# Patient Record
Sex: Female | Born: 1970 | Race: Black or African American | Hispanic: No | Marital: Single | Smoking: Current every day smoker
Health system: Southern US, Community
[De-identification: ages and names within clinical notes are randomized; demographics above are authoritative.]

## PROBLEM LIST (undated history)

## (undated) DIAGNOSIS — R011 Cardiac murmur, unspecified: Secondary | ICD-10-CM

## (undated) DIAGNOSIS — N83209 Unspecified ovarian cyst, unspecified side: Secondary | ICD-10-CM

## (undated) DIAGNOSIS — M797 Fibromyalgia: Secondary | ICD-10-CM

## (undated) DIAGNOSIS — N393 Stress incontinence (female) (male): Secondary | ICD-10-CM

## (undated) DIAGNOSIS — I2699 Other pulmonary embolism without acute cor pulmonale: Secondary | ICD-10-CM

## (undated) DIAGNOSIS — L309 Dermatitis, unspecified: Secondary | ICD-10-CM

## (undated) DIAGNOSIS — F419 Anxiety disorder, unspecified: Secondary | ICD-10-CM

## (undated) HISTORY — DX: Cardiac murmur, unspecified: R01.1

## (undated) HISTORY — DX: Fibromyalgia: M79.7

## (undated) HISTORY — DX: Unspecified ovarian cyst, unspecified side: N83.209

## (undated) HISTORY — DX: Dermatitis, unspecified: L30.9

## (undated) HISTORY — DX: Stress incontinence (female) (male): N39.3

## (undated) HISTORY — DX: Anxiety disorder, unspecified: F41.9

---

## 1999-02-08 ENCOUNTER — Inpatient Hospital Stay (HOSPITAL_COMMUNITY): Admission: AD | Admit: 1999-02-08 | Discharge: 1999-02-08 | Payer: Self-pay | Admitting: Obstetrics and Gynecology

## 1999-07-12 ENCOUNTER — Encounter: Payer: Self-pay | Admitting: Emergency Medicine

## 1999-07-12 ENCOUNTER — Emergency Department (HOSPITAL_COMMUNITY): Admission: EM | Admit: 1999-07-12 | Discharge: 1999-07-12 | Payer: Self-pay | Admitting: Emergency Medicine

## 2000-02-12 ENCOUNTER — Other Ambulatory Visit: Admission: RE | Admit: 2000-02-12 | Discharge: 2000-02-12 | Payer: Self-pay | Admitting: Obstetrics and Gynecology

## 2001-03-17 ENCOUNTER — Other Ambulatory Visit: Admission: RE | Admit: 2001-03-17 | Discharge: 2001-03-17 | Payer: Self-pay | Admitting: Obstetrics and Gynecology

## 2002-04-20 ENCOUNTER — Other Ambulatory Visit: Admission: RE | Admit: 2002-04-20 | Discharge: 2002-04-20 | Payer: Self-pay | Admitting: Obstetrics and Gynecology

## 2004-06-27 ENCOUNTER — Encounter: Admission: RE | Admit: 2004-06-27 | Discharge: 2004-06-27 | Payer: Self-pay | Admitting: Internal Medicine

## 2005-02-07 ENCOUNTER — Other Ambulatory Visit: Admission: RE | Admit: 2005-02-07 | Discharge: 2005-02-07 | Payer: Self-pay | Admitting: Obstetrics and Gynecology

## 2006-05-12 ENCOUNTER — Emergency Department (HOSPITAL_COMMUNITY): Admission: EM | Admit: 2006-05-12 | Discharge: 2006-05-12 | Payer: Self-pay | Admitting: Family Medicine

## 2007-02-27 ENCOUNTER — Inpatient Hospital Stay (HOSPITAL_COMMUNITY): Admission: AD | Admit: 2007-02-27 | Discharge: 2007-02-27 | Payer: Self-pay | Admitting: Obstetrics and Gynecology

## 2007-03-07 ENCOUNTER — Inpatient Hospital Stay (HOSPITAL_COMMUNITY): Admission: AD | Admit: 2007-03-07 | Discharge: 2007-03-10 | Payer: Self-pay | Admitting: Obstetrics and Gynecology

## 2007-03-08 ENCOUNTER — Encounter (INDEPENDENT_AMBULATORY_CARE_PROVIDER_SITE_OTHER): Payer: Self-pay | Admitting: Obstetrics and Gynecology

## 2008-05-17 ENCOUNTER — Encounter: Admission: RE | Admit: 2008-05-17 | Discharge: 2008-05-17 | Payer: Self-pay | Admitting: Internal Medicine

## 2010-02-04 ENCOUNTER — Encounter: Payer: Self-pay | Admitting: Obstetrics and Gynecology

## 2010-05-29 NOTE — Discharge Summary (Signed)
NAMESHYLIN, KEIZER               ACCOUNT NO.:  [id number]   MEDICAL RECORD NO.:  [id number]          PATIENT TYPE:  INP   LOCATION:  9131                          FACILITY:  [facility]   PHYSICIAN:  Zenaida Niece, M.D.DATE OF BIRTH:  12-07-1970   DATE OF ADMISSION:  03/07/2007  DATE OF DISCHARGE:  03/10/2007                               DISCHARGE SUMMARY   ADMISSION DIAGNOSIS:  Intrauterine pregnancy at 39 weeks.  Previous  cesarean section.  In labor, variable decelerations, and small for  gestational age.   DISCHARGE DIAGNOSIS:  Intrauterine pregnancy at 39 weeks.  Previous  cesarean section.  In labor, variable decelerations, and small for  gestational age.   PROCEDURE:  On February 21, she had a repeat low transverse cesarean  section.   HISTORY AND PHYSICAL:  Please see chart for full dictated history and  physical.  Briefly, this is a 40 year old black female, gravida 2, para  1-0-0-1 with an EGA of [redacted] weeks who was scheduled for a repeat cesarean  section on February 23; however, she presented on February 21 with a  complaint of  possibly leaking fluid and contractions.  Speculum exam  did not confirm rupture of membranes and cervix was unchanged from the  exam in the office; however, fetal heart tracing had variable decels.  Biophysical profile was 8/8 with low normal amniotic fluid volume.  However, due to the variable decels and contractions, we elected to go  ahead and admit her and proceed with cesarean section.  Again, please  see full history and physical for prenatal labs and the remainder of her  history.  Exam significant for a gravid abdomen with a well-healed  vertical scar and cervix was 1, 80 and -2.   HOSPITAL COURSE:  The patient was admitted on February 21 and underwent  repeat low transverse cesarean section under spinal anesthesia.  She had  normal gravid anatomy and delivered a viable female infant with Apgars  of 8 and 9 who weighed 4 pounds, 14  ounces.  Estimated blood loss was  800 mL.  Postpartum she had no significant complications.  Predelivery  hemoglobin 10.7, postdelivery 8.4.  On postoperative #3, she was felt to  be stable enough for discharge home.  At that time, her incision was  healing well and her staples were removed and Steri-Strips applied.   DISCHARGE INSTRUCTIONS:  Regular diet, pelvic rest, no strenuous  activity.  Followup is in 2 weeks for an incision check.  Medications  are Percocet #40, 1 to 2 p.o. q.4-6h. p.r.n. pain and over-the-counter  ibuprofen as needed.  She was  given our discharge pamphlet.      Zenaida Niece, M.D.  Electronically Signed     TDM/MEDQ  D:  03/10/2007  T:  03/10/2007  Job:  [id number]

## 2010-05-29 NOTE — Op Note (Signed)
[name], [name]               ACCOUNT NO.:  [id number]   MEDICAL RECORD NO.:  [id number]          PATIENT TYPE:  INP   LOCATION:  9131                          FACILITY:  [facility]   PHYSICIAN:  Zenaida Niece, M.D.DATE OF BIRTH:  December 24, 1970   DATE OF PROCEDURE:  03/07/2007  DATE OF DISCHARGE:                               OPERATIVE REPORT   DATE OF PROCEDURE:  03/07/2007   PREOPERATIVE DIAGNOSIS:  Intrauterine pregnancy at 39 weeks, previous  cesarean section, labor. Fetal heart rate variable decelerations and  small for gestational age.   POSTOPERATIVE DIAGNOSIS:  Intrauterine pregnancy at 39 weeks, previous  cesarean section, labor. Fetal heart rate variable decelerations and  small for gestational age.   PROCEDURE:  Repeat low transverse cesarean section without extensions.   SURGEON:  Zenaida Niece, M.D.   ANESTHESIA:  Spinal.   FINDINGS:  The patient had normal gravid anatomy and delivered a viable  female infant of with Apgars of 8 and 9 and weighed 4 pounds, 14 ounces.   SPECIMEN:  Placenta sent to Pathology.   ESTIMATED BLOOD LOSS:  800 cc.   COMPLICATIONS:  None.   PROCEDURE IN DETAIL:  The patient was taken to the operating room and  placed in the sitting position.  Dr. Pamalee Leyden installed spinal anesthesia.  She was then placed in the dorsal supine position with a left lateral  tilt.  Her abdomen was then prepped and draped in the usual sterile  fashion and a Foley catheter was inserted.  The level of her anesthesia  was found to be adequate, and the abdomen was entered via a standard  Pfannenstiel incision.  Once the peritoneal cavity was entered, an  Youth worker was placed and exposed the  lower uterine segment.  There was a small amount of scarring, and a  bladder flap was created sharply.  A 4 cm transverse incision was then  made in the lower uterine segment.  Once the uterine cavity was entered,  the incision was  extended bilaterally digitally.  Membranes were  ruptured and revealed clear fluid.  Fetal vertex was grasped and  delivered through the incision atraumatically.  Mouth and nares were  suctioned.  A loose nuchal cord x1 was reduced.  The remainder of the  infant was then delivered atraumatically.  Cord was doubly clamped and  cut and the infant handed to the awaiting pediatric team.  The placenta  was delivered spontaneously and after cord blood was collected.  The  uterus was wiped dry with a clean lap pad, and all clots and debris were  removed.  The uterine incision was inspected and found to be free of  extensions.  Uterine incision was closed in one layer being a running  locking layer with #1 chromic with adequate hemostasis.  The tubes and  ovaries were inspected and found to be normal.  The uterine incision  once again was inspected.  Bleeding from serosal edges was controlled  with electrocautery.  Subfascial space was then irrigated and made  hemostatic with electrocautery.  The fascia was closed in a  running  fashion starting at both ends and meeting in the middle with 0 Vicryl.  Subcutaneous tissue was then irrigated and made hemostatic with  electrocautery, and the skin was closed with staples followed by a  sterile dressing.  The patient tolerated the procedure well and was  taken to the recovery room in stable condition.  The urine was slightly  blood-tinged at the end of the case.  Counts were correct x2.  She  received Ancef 1 gm IV prior to the procedure.      Zenaida Niece, M.D.  Electronically Signed     TDM/MEDQ  D:  03/07/2007  T:  03/08/2007  Job:  [id number]

## 2010-05-29 NOTE — H&P (Signed)
[name], [name]               ACCOUNT NO.:  [id number]   MEDICAL RECORD NO.:  [id number]          PATIENT TYPE:  INP   LOCATION:  NA                            [facility]  [facility]   PHYSICIAN:  Zenaida Niece, M.D.DATE OF BIRTH:  [birth year]   DATE OF ADMISSION:  03/09/2007  DATE OF DISCHARGE:                              HISTORY & PHYSICAL   CHIEF COMPLAINT:  Repeat cesarean section.   HISTORY OF PRESENT ILLNESS:  This is a 40 year old black female, gravida  2, para 1-0-0-1, with an EGA of 39+ weeks by an LMP consistent with a 9-  week ultrasound with a due date of February 28 who presents for repeat  cesarean section.  First pregnancy she had a primary low transverse  cesarean section for elevated blood pressure and decreased heart rate.  She is cleared for a trial labor but declines this and wishes to undergo  repeat cesarean section.  Prenatal care has been complicated by first  trimester UTI with E. coli, treated with Macrobid.  She has also  measured size less than dates with reactive nonstress tests.  She has  had borderline decreased amniotic fluid.  Most recent estimated fetal  weight was on February 10, which was 2324 grams, which was less than the  10th percentile, and deepest amniotic fluid pocket was 3.8 cm.  Repeat  amniotic fluid on February 17 had a deepest pocket of 2.9 cm.  Again  NSTs have been reactive.  She has been using Zantac and antacids for  reflux.  No other complications.   PRENATAL LABORATORIES:  Blood type is O+ with a negative antibody  screen.  RPR nonreactive, rubella immune, hepatitis B surface antigen  negative, HIV negative, gonorrhea and chlamydia negative.  Cystic  fibrosis is negative.  MSAFP is normal.  One-hour Glucola is 87 and  group B strep is positive.   PAST OBSTETRIC HISTORY:  In 1993 she had a low transverse cesarean  section for failed induction and failure to progress.  The baby weighed  7 pounds, 8 ounces.   PAST MEDICAL  HISTORY:  History of anxiety and depression as well a  history of migraine headaches.   PAST SURGICAL HISTORY:  Only significant for the cesarean section.   ALLERGIES:  POSSIBLY TO LATEX.   MEDICATIONS:  None.   SOCIAL HISTORY:  She is single and smoked minimally prior to pregnancy.   REVIEW OF SYSTEMS:  Normal complaints of pregnancy.   FAMILY HISTORY:  Noncontributory.   PHYSICAL EXAMINATION:  GENERAL:  This is a well-developed gravid female  in no acute distress.  VITAL SIGNS:  Weight is 161 pounds, blood pressure is 120/70.  NECK:  Supple without lymphadenopathy or thyromegaly.  LUNGS:  Clear to auscultation.  HEART:  Regular rate and rhythm without murmur.  ABDOMEN:  Gravid, soft, nontender with a fundal height of 34 cm.  She  does have a well-healed vertical scar.  EXTREMITIES:  Have trace edema and are nontender.  GENITOURINARY:  Cervix is 1, 80, -2 vertex presentation.   ASSESSMENT:  1. Intrauterine pregnancy at 39+ weeks.  2. Previous cesarean section.  The patient is cleared for a trial of      labor but declines this and wants repeat cesarean section.  All      risks of surgery have been discussed, and she understands.  She has      a previous vertical scar on her skin but would like a transverse      scar with this C-section.  3. Size less than dates with reassuring fetal testing.   The plan is to admit the patient on February 23rd for repeat cesarean  section.      Zenaida Niece, M.D.  Electronically Signed     TDM/MEDQ  D:  03/06/2007  T:  03/06/2007  Job:  [id number]

## 2010-10-05 LAB — CBC
HCT: 24.8 — ABNORMAL LOW
Hemoglobin: 10.7 — ABNORMAL LOW
Hemoglobin: 8.4 — ABNORMAL LOW
MCHC: 33.2
MCV: 83.2
MCV: 84.3
Platelets: 237
RBC: 3.82 — ABNORMAL LOW
WBC: 13.1 — ABNORMAL HIGH
WBC: 13.3 — ABNORMAL HIGH

## 2010-10-05 LAB — RPR: RPR Ser Ql: NONREACTIVE

## 2012-02-03 ENCOUNTER — Other Ambulatory Visit: Payer: Self-pay | Admitting: Obstetrics and Gynecology

## 2012-02-03 ENCOUNTER — Other Ambulatory Visit (HOSPITAL_COMMUNITY)
Admission: RE | Admit: 2012-02-03 | Discharge: 2012-02-03 | Disposition: A | Discharge status: Home / Self Care | Payer: 59 | Source: Ambulatory Visit | Attending: Obstetrics and Gynecology | Admitting: Obstetrics and Gynecology

## 2012-02-03 DIAGNOSIS — Z01419 Encounter for gynecological examination (general) (routine) without abnormal findings: Secondary | ICD-10-CM | POA: Insufficient documentation

## 2012-02-03 DIAGNOSIS — Z1151 Encounter for screening for human papillomavirus (HPV): Secondary | ICD-10-CM | POA: Insufficient documentation

## 2012-02-03 DIAGNOSIS — N760 Acute vaginitis: Secondary | ICD-10-CM | POA: Insufficient documentation

## 2012-03-05 ENCOUNTER — Other Ambulatory Visit: Payer: Self-pay | Admitting: Obstetrics and Gynecology

## 2014-02-03 ENCOUNTER — Emergency Department (HOSPITAL_COMMUNITY): Payer: Medicaid Other

## 2014-02-03 ENCOUNTER — Inpatient Hospital Stay (HOSPITAL_COMMUNITY): Payer: Medicaid Other

## 2014-02-03 ENCOUNTER — Encounter (HOSPITAL_COMMUNITY): Payer: Self-pay | Admitting: *Deleted

## 2014-02-03 ENCOUNTER — Inpatient Hospital Stay (HOSPITAL_COMMUNITY)
Admission: EM | Admit: 2014-02-03 | Discharge: 2014-02-07 | DRG: 759 | Disposition: A | Discharge status: Home / Self Care | Payer: Medicaid Other | Attending: Obstetrics & Gynecology | Admitting: Obstetrics & Gynecology

## 2014-02-03 DIAGNOSIS — N7093 Salpingitis and oophoritis, unspecified: Secondary | ICD-10-CM | POA: Diagnosis not present

## 2014-02-03 DIAGNOSIS — Z91040 Latex allergy status: Secondary | ICD-10-CM

## 2014-02-03 DIAGNOSIS — F17210 Nicotine dependence, cigarettes, uncomplicated: Secondary | ICD-10-CM | POA: Diagnosis present

## 2014-02-03 DIAGNOSIS — K651 Peritoneal abscess: Secondary | ICD-10-CM

## 2014-02-03 DIAGNOSIS — N739 Female pelvic inflammatory disease, unspecified: Secondary | ICD-10-CM | POA: Diagnosis present

## 2014-02-03 DIAGNOSIS — R52 Pain, unspecified: Secondary | ICD-10-CM

## 2014-02-03 DIAGNOSIS — R1030 Lower abdominal pain, unspecified: Secondary | ICD-10-CM

## 2014-02-03 LAB — URINALYSIS, ROUTINE W REFLEX MICROSCOPIC
Bilirubin Urine: NEGATIVE
GLUCOSE, UA: NEGATIVE mg/dL
KETONES UR: 15 mg/dL — AB
Leukocytes, UA: NEGATIVE
Nitrite: NEGATIVE
PH: 6.5 (ref 5.0–8.0)
Protein, ur: NEGATIVE mg/dL
SPECIFIC GRAVITY, URINE: 1.01 (ref 1.005–1.030)
Urobilinogen, UA: 0.2 mg/dL (ref 0.0–1.0)

## 2014-02-03 LAB — COMPREHENSIVE METABOLIC PANEL
ALT: 20 U/L (ref 0–35)
ANION GAP: 9 (ref 5–15)
AST: 27 U/L (ref 0–37)
Albumin: 3.4 g/dL — ABNORMAL LOW (ref 3.5–5.2)
Alkaline Phosphatase: 108 U/L (ref 39–117)
BUN: 7 mg/dL (ref 6–23)
CALCIUM: 8.8 mg/dL (ref 8.4–10.5)
CHLORIDE: 103 meq/L (ref 96–112)
CO2: 23 mmol/L (ref 19–32)
CREATININE: 0.84 mg/dL (ref 0.50–1.10)
GFR, EST NON AFRICAN AMERICAN: 85 mL/min — AB (ref >90)
GLUCOSE: 98 mg/dL (ref 70–99)
POTASSIUM: 3.5 mmol/L (ref 3.5–5.1)
SODIUM: 135 mmol/L (ref 135–145)
Total Bilirubin: 1 mg/dL (ref 0.3–1.2)
Total Protein: 7.3 g/dL (ref 6.0–8.3)

## 2014-02-03 LAB — CBC WITH DIFFERENTIAL/PLATELET
BASOS ABS: 0 10*3/uL (ref 0.0–0.1)
Basophils Relative: 0 % (ref 0–1)
Eosinophils Absolute: 0.1 10*3/uL (ref 0.0–0.7)
Eosinophils Relative: 0 % (ref 0–5)
HCT: 35.1 % — ABNORMAL LOW (ref 36.0–46.0)
HEMOGLOBIN: 11.5 g/dL — AB (ref 12.0–15.0)
LYMPHS PCT: 15 % (ref 12–46)
Lymphs Abs: 3.1 10*3/uL (ref 0.7–4.0)
MCH: 26 pg (ref 26.0–34.0)
MCHC: 32.8 g/dL (ref 30.0–36.0)
MCV: 79.2 fL (ref 78.0–100.0)
MONO ABS: 1.4 10*3/uL — AB (ref 0.1–1.0)
MONOS PCT: 6 % (ref 3–12)
NEUTROS ABS: 16.8 10*3/uL — AB (ref 1.7–7.7)
Neutrophils Relative %: 79 % — ABNORMAL HIGH (ref 43–77)
Platelets: 324 10*3/uL (ref 150–400)
RBC: 4.43 MIL/uL (ref 3.87–5.11)
RDW: 14.3 % (ref 11.5–15.5)
WBC: 21.4 10*3/uL — AB (ref 4.0–10.5)

## 2014-02-03 LAB — URINE MICROSCOPIC-ADD ON

## 2014-02-03 LAB — WET PREP, GENITAL
Clue Cells Wet Prep HPF POC: NONE SEEN
TRICH WET PREP: NONE SEEN
WBC, Wet Prep HPF POC: NONE SEEN
Yeast Wet Prep HPF POC: NONE SEEN

## 2014-02-03 LAB — LIPASE, BLOOD: LIPASE: 23 U/L (ref 11–59)

## 2014-02-03 LAB — PREGNANCY, URINE: Preg Test, Ur: NEGATIVE

## 2014-02-03 MED ORDER — HYDROMORPHONE HCL 2 MG/ML IJ SOLN
2.0000 mg | INTRAMUSCULAR | Status: DC | PRN
  Administered 2014-02-03: 2 mg via INTRAVENOUS
  Filled 2014-02-03: qty 1

## 2014-02-03 MED ORDER — SODIUM CHLORIDE 0.9 % IV BOLUS (SEPSIS)
1000.0000 mL | Freq: Once | INTRAVENOUS | Status: AC
  Administered 2014-02-03: 1000 mL via INTRAVENOUS

## 2014-02-03 MED ORDER — IOHEXOL 300 MG/ML  SOLN
25.0000 mL | Freq: Once | INTRAMUSCULAR | Status: AC | PRN
  Administered 2014-02-03: 25 mL via ORAL

## 2014-02-03 MED ORDER — HYDROMORPHONE HCL 1 MG/ML IJ SOLN
1.0000 mg | Freq: Once | INTRAMUSCULAR | Status: AC
  Administered 2014-02-03: 1 mg via INTRAVENOUS
  Filled 2014-02-03: qty 1

## 2014-02-03 MED ORDER — ONDANSETRON HCL 4 MG/2ML IJ SOLN
4.0000 mg | Freq: Once | INTRAMUSCULAR | Status: AC
Start: 2014-02-03 — End: 2014-02-03
  Administered 2014-02-03: 4 mg via INTRAVENOUS
  Filled 2014-02-03: qty 2

## 2014-02-03 MED ORDER — IOHEXOL 300 MG/ML  SOLN: 100.0000 mL | Freq: Once | INTRAMUSCULAR | Status: DC | PRN

## 2014-02-03 MED ORDER — PIPERACILLIN-TAZOBACTAM 3.375 G IVPB
3.3750 g | Freq: Three times a day (TID) | INTRAVENOUS | Status: DC
  Administered 2014-02-03 – 2014-02-07 (×11): 3.375 g via INTRAVENOUS
  Filled 2014-02-03 (×12): qty 50

## 2014-02-03 MED ORDER — ONDANSETRON HCL 4 MG/2ML IJ SOLN
4.0000 mg | Freq: Four times a day (QID) | INTRAMUSCULAR | Status: DC | PRN
  Administered 2014-02-05 – 2014-02-06 (×2): 4 mg via INTRAVENOUS
  Filled 2014-02-03 (×2): qty 2

## 2014-02-03 MED ORDER — MORPHINE SULFATE 4 MG/ML IJ SOLN
4.0000 mg | Freq: Once | INTRAMUSCULAR | Status: AC
  Administered 2014-02-03: 4 mg via INTRAVENOUS
  Filled 2014-02-03: qty 1

## 2014-02-03 MED ORDER — ZOLPIDEM TARTRATE 5 MG PO TABS: 5.0000 mg | ORAL_TABLET | Freq: Every evening | ORAL | Status: DC | PRN

## 2014-02-03 MED ORDER — IOHEXOL 300 MG/ML  SOLN
80.0000 mL | Freq: Once | INTRAMUSCULAR | Status: AC | PRN
Start: 2014-02-03 — End: 2014-02-03
  Administered 2014-02-03: 1 mL via INTRAVENOUS

## 2014-02-03 MED ORDER — VANCOMYCIN HCL IN DEXTROSE 1-5 GM/200ML-% IV SOLN
1000.0000 mg | Freq: Once | INTRAVENOUS | Status: AC
  Administered 2014-02-03: 1000 mg via INTRAVENOUS
  Filled 2014-02-03: qty 200

## 2014-02-03 MED ORDER — PIPERACILLIN-TAZOBACTAM 3.375 G IVPB 30 MIN
3.3750 g | Freq: Once | INTRAVENOUS | Status: AC
  Administered 2014-02-03: 3.375 g via INTRAVENOUS
  Filled 2014-02-03: qty 50

## 2014-02-03 MED ORDER — PIPERACILLIN-TAZOBACTAM 3.375 G IVPB 30 MIN: 3.3750 g | Freq: Four times a day (QID) | INTRAVENOUS | Status: DC

## 2014-02-03 MED ORDER — DIPHENHYDRAMINE HCL 25 MG PO CAPS
50.0000 mg | ORAL_CAPSULE | ORAL | Status: DC | PRN
  Administered 2014-02-03 – 2014-02-04 (×2): 50 mg via ORAL
  Filled 2014-02-03 (×3): qty 2

## 2014-02-03 MED ORDER — LACTATED RINGERS IV SOLN
INTRAVENOUS | Status: DC
  Administered 2014-02-03 – 2014-02-06 (×8): via INTRAVENOUS

## 2014-02-03 MED ORDER — PRENATAL MULTIVITAMIN CH
1.0000 | ORAL_TABLET | Freq: Every day | ORAL | Status: DC
  Administered 2014-02-04 – 2014-02-06 (×3): 1 via ORAL
  Filled 2014-02-03 (×4): qty 1

## 2014-02-03 MED ORDER — OXYCODONE-ACETAMINOPHEN 5-325 MG PO TABS
1.0000 | ORAL_TABLET | ORAL | Status: DC | PRN
  Administered 2014-02-04 – 2014-02-06 (×5): 2 via ORAL
  Filled 2014-02-03 (×2): qty 2
  Filled 2014-02-03: qty 1
  Filled 2014-02-03 (×3): qty 2

## 2014-02-03 NOTE — ED Notes (Signed)
Pt in c/o suprapubic abd pain since Tuesday, denies n/v, pain worse in certain positions, chills but unsure of fever, no distress noted

## 2014-02-03 NOTE — MAU Note (Signed)
[facility] with abd [facility].  Dx. With tubo-ovarian abscess with localized inflammatory changes and a pre-rectal abscess possible.  Denies vaginal bleeding.

## 2014-02-03 NOTE — ED Notes (Signed)
[facility]ed [facility] for [facility] to [facility]

## 2014-02-03 NOTE — MAU Provider Note (Signed)
See H& P.

## 2014-02-03 NOTE — ED Notes (Signed)
Could not get signature pad to accept signature.  Provided paper copy, signed by pt and RN.  Provided original to secretary for processing, will provide copy in transfer packet.

## 2014-02-03 NOTE — ED Notes (Signed)
Patient transported to CT 

## 2014-02-03 NOTE — ED Provider Notes (Signed)
CSN: [id number]     Arrival date & time 02/03/14  0825 History   First MD Initiated Contact with Patient 02/03/14 [phone]     Chief Complaint  Patient presents with  . Abdominal Pain     (Consider location/radiation/quality/duration/timing/severity/associated sxs/prior Treatment) HPI   Pt presents with diffuse abdominal pain and bloating that began two days ago.  Pain is constant, started as cramping and is now sharp.  Assocaited anorexia, chills.  Exacerbated with standing up straight and palpation.  Has tried ibuprofen and muscle relaxer without imrpovement.    Last BM was two days ago and was normal.  Is able to pass flatus.  Hx 2 c-sections no other abdominal surgeries. Denies fevers, N/V, urinary or vaginal symptoms, change in bowel habits. Denies CP, SOB.  Denies risk of STDs.  History reviewed. No pertinent past medical history. History reviewed. No pertinent past surgical history. History reviewed. No pertinent family history. History  Substance Use Topics  . Smoking status: Current Every Day Smoker  . Smokeless tobacco: Not on file  . Alcohol Use: Not on file   OB History    No data available     Review of Systems  All other systems reviewed and are negative.     Allergies  Review of patient's allergies indicates no known allergies.  Home Medications   Prior to Admission medications   Not on File   BP 125/48 mmHg  Pulse 115  Temp(Src) 98.2 F (36.8 C) (Oral)  Resp 20  Ht  (1.575 m)  Wt 144 lb (65.318 kg)  BMI 26.33 kg/m2  SpO2 10%  LMP 01/20/2014 Physical Exam  Constitutional: She appears well-developed and well-nourished. No distress.  HENT:  Head: Normocephalic and atraumatic.  Neck: Neck supple.  Cardiovascular: Normal rate and regular rhythm.   Pulmonary/Chest: Effort normal and breath sounds normal. No respiratory distress. She has no wheezes. She has no rales.  Abdominal: Soft. She exhibits distension. There is tenderness.  Pt tender  throughout abdomen, reaches for my hand, unable to tolerate deep palpation.  +voluntary guarding.   Neurological: She is alert.  Skin: She is not diaphoretic.  Nursing note and vitals reviewed.   ED Course  Procedures (including critical care time) Labs Review Labs Reviewed  CBC WITH DIFFERENTIAL - Abnormal; Notable for the following:    WBC 21.4 (*)    Hemoglobin 11.5 (*)    HCT 35.1 (*)    Neutrophils Relative % 79 (*)    Neutro Abs 16.8 (*)    Monocytes Absolute 1.4 (*)    All other components within normal limits  COMPREHENSIVE METABOLIC PANEL - Abnormal; Notable for the following:    Albumin 3.4 (*)    GFR calc non Af Amer 85 (*)    All other components within normal limits  URINALYSIS, ROUTINE W REFLEX MICROSCOPIC - Abnormal; Notable for the following:    Hgb urine dipstick LARGE (*)    Ketones, ur 15 (*)    All other components within normal limits  URINE MICROSCOPIC-ADD ON - Abnormal; Notable for the following:    Squamous Epithelial / LPF FEW (*)    Bacteria, UA FEW (*)    All other components within normal limits  LIPASE, BLOOD  PREGNANCY, URINE    Imaging Review Ct Abdomen Pelvis W Contrast  02/03/2014   CLINICAL DATA:  Suprapubic pain for 2 days  EXAM: CT ABDOMEN AND PELVIS WITH CONTRAST  TECHNIQUE: Multidetector CT imaging of the abdomen and pelvis was  performed using the standard protocol following bolus administration of intravenous contrast.  CONTRAST:  75 mL OMNIPAQUE IOHEXOL 300 MG/ML  SOLN  COMPARISON:  02/03/2014  FINDINGS: Lung bases demonstrate some mild dependent atelectatic changes. No focal infiltrate is seen.  Liver demonstrates a hypodensity near the dome posteriorly best seen on image number 12 of series 2. This may represent a simple cyst or hemangioma although evaluation is limited. No other hepatic abnormality is seen. The spleen, gallbladder, adrenal glands and pancreas appear within normal limits. The kidneys are well visualized bilaterally. Small  cyst is noted in the upper pole of the right kidney.  The bladder is well distended. The uterus is well visualized and demonstrates fluid within likely related to the patient's current menstrual status.  Posterior and superior to the uterus, there is significant bowel wall thickening in this rectosigmoid as well as a peripherally enhancing fluid collection which measures approximately 4 x 2.5 cm in greatest dimension. This lies just anterior to the rectum. A second fluid attenuation area is noted which measures 2.3 cm in greatest dimension. This is best seen on image number 67 of series 2 and lies in the expected region of right ovary. Is difficult to assess the etiology of these abnormalities although a tubo-ovarian abscess with localized extension into the pre rectal space is favored. The lack of significant diverticular disease makes diverticulitis a secondary consideration.  IMPRESSION: Significant inflammatory changes in the pelvis with extension into the lower abdomen as described. A tubo-ovarian abscess with localized inflammatory change and a pre-rectal abscess is favored. Surgical consultation is recommended.  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  These results were called by telephone at the time of interpretation on 02/03/2014 at 1:44 pm to Margarita Grizzle, who verbally acknowledged these results.   Electronically Signed   By: Alcide Clever M.D.   On: 02/03/2014 13:49   Dg Abd Acute W/chest  02/03/2014   CLINICAL DATA:  One day history of abdominal pain  EXAM: ACUTE ABDOMEN SERIES (ABDOMEN 2 VIEW & CHEST 1 VIEW)  COMPARISON:  None.  FINDINGS: PA chest: There is minimal right base atelectasis. No edema or consolidation. Heart size and pulmonary vascularity are normal. No adenopathy.  Supine and upright abdomen: There is diffuse stool throughout colon. There is several loops of borderline dilated proximal small bowel. No air-fluid levels. No free air. No abnormal calcifications.  IMPRESSION: Several loops of  borderline dilated proximal small bowel without air-fluid levels. Question early ileus or enteritis. Obstruction not felt to be likely. Diffuse stool throughout colon. No lung edema or consolidation.   Electronically Signed   By: Bretta Bang M.D.   On: 02/03/2014 09:52     EKG Interpretation None       2:47 PM General surgery to consult.   3:20 PM General surgery has reviewed CT scan and advised this is likely primarily a GYN issue and should be seen by GYN.  I have spoken with Dr Debroah Loop who accepts patient in transfer to [facility].  Pt's gynecologist was Dr Jackelyn Knife but she has not seen him since 2009 when her last child was born.    MDM   Final diagnoses:  Pain  Tubo-ovarian abscess  Intra-abdominal abscess    Afebrile nontoxic patient with significant abdominal discomfort, bloating, anorexia, chills.  Found to have leukocytosis of 21 and CT showing TOA and prerectal abscess with inflammatory bowel changes.  Labs otherwise unremarkable.  Pt has hematuria but for unclear reason - pt notes her  menstrual period is not due for 6 days and denies urinary symptoms.  Pelvic not performed in ED as patient continued to be too uncomfortable for a full exam and CT scan resulted prior to pain control achieved.  I offered to perform pelvic but this will be completed after [facility].  VSS.  Pt to be transferred, Dr Debroah LoopArnold, unassigned GYN accepting at [facility].      Trixie Dredgemily West, PA-C 02/03/14 1525  Hilario Quarryanielle S Dayne Chait, MD 02/04/14 [phone]

## 2014-02-03 NOTE — ED Notes (Signed)
Pt finished contrast CT called.

## 2014-02-03 NOTE — H&P (Signed)
[name] is an 44 y.o. female sent to maternity admissions from [place] for possible tubo-ovarian abscess and prerectal abscess diagnosed by CT. Pelvic exam not done at ED due to severe pain. Zosyn and vancomycin given prior to transfer to [facility]. See ED note. General surgeon reviewed CT and recommended GYN management.  Pertinent Gynecological History: Menses: flow is moderate and regular every month without intermenstrual spotting Bleeding: None Blood transfusions: none Sexually transmitted diseases: no past history Previous GYN Procedures: None  Last pap: normal Date: ~2009 OB History: G2, P2002. C-section 2   Menstrual History: Patient's last menstrual period was 01/20/2014 (exact date).    History reviewed. No pertinent past medical history.  Past Surgical History  Procedure Laterality Date  . Cesarean section      History reviewed. No pertinent family history.  Social History:  reports that she has been smoking.  She does not have any smokeless tobacco history on file. She reports that she drinks alcohol. She reports that she uses illicit drugs.  Allergies:  Allergies  Allergen Reactions  . Latex Rash    Prescriptions prior to admission  Medication Sig Dispense Refill Last Dose  . ibuprofen (ADVIL,MOTRIN) 200 MG tablet Take 600 mg by mouth every 6 (six) hours as needed (pain).   02/03/2014 at Unknown time    Review of Systems  Constitutional: Positive for chills. Negative for fever.  Gastrointestinal: Positive for nausea and abdominal pain. Negative for vomiting, diarrhea, constipation and blood in stool.  Genitourinary: Negative for dysuria, urgency, frequency, hematuria and flank pain.    Blood pressure 110/51, pulse 96, temperature 98.6 F (37 C), temperature source Oral, resp. rate 18, height 5' 2.5" (1.588 m), weight 65.318 kg (144 lb), last menstrual period 01/20/2014, SpO2 100 %. Physical Exam  Nursing note and vitals  reviewed. Constitutional: She appears well-developed and well-nourished. She appears distressed.  Eyes: Conjunctivae are normal.  Cardiovascular: Normal rate, regular rhythm and normal heart sounds.   Respiratory: Effort normal and breath sounds normal. No respiratory distress.  GI: Soft. She exhibits no distension and no mass. There is tenderness. There is guarding. There is no rebound.  Genitourinary: Vagina normal. There is no rash, tenderness or lesion on the right labia. There is no rash, tenderness or lesion on the left labia. Uterus is tender. Uterus is not enlarged. Cervix exhibits motion tenderness (displaced to left). Cervix exhibits no discharge and no friability. Right adnexum displays tenderness (mild). Right adnexum displays no mass and no fullness. Left adnexum displays no fullness. No bleeding in the vagina. No vaginal discharge found.  Skin: She is not diaphoretic.    Results for orders placed or performed during the hospital encounter of 02/03/14 (from the past 24 hour(s))  CBC WITH DIFFERENTIAL     Status: Abnormal   Collection Time: 02/03/14  8:50 AM  Result Value Ref Range   WBC 21.4 (H) 4.0 - 10.5 K/uL   RBC 4.43 3.87 - 5.11 MIL/uL   Hemoglobin 11.5 (L) 12.0 - 15.0 g/dL   HCT 96.0 (L) 45.4 - 09.8 %   MCV 79.2 78.0 - 100.0 fL   MCH 26.0 26.0 - 34.0 pg   MCHC 32.8 30.0 - 36.0 g/dL   RDW 11.9 14.7 - 82.9 %   Platelets 324 150 - 400 K/uL   Neutrophils Relative % 79 (H) 43 - 77 %   Neutro Abs 16.8 (H) 1.7 - 7.7 K/uL   Lymphocytes Relative 15 12 - 46 %  Lymphs Abs 3.1 0.7 - 4.0 K/uL   Monocytes Relative 6 3 - 12 %   Monocytes Absolute 1.4 (H) 0.1 - 1.0 K/uL   Eosinophils Relative 0 0 - 5 %   Eosinophils Absolute 0.1 0.0 - 0.7 K/uL   Basophils Relative 0 0 - 1 %   Basophils Absolute 0.0 0.0 - 0.1 K/uL  Comprehensive metabolic panel     Status: Abnormal   Collection Time: 02/03/14  8:50 AM  Result Value Ref Range   Sodium 135 135 - 145 mmol/L   Potassium 3.5 3.5 -  5.1 mmol/L   Chloride 103 96 - 112 mEq/L   CO2 23 19 - 32 mmol/L   Glucose, Bld 98 70 - 99 mg/dL   BUN 7 6 - 23 mg/dL   Creatinine, Ser 1.61 0.50 - 1.10 mg/dL   Calcium 8.8 8.4 - 09.6 mg/dL   Total Protein 7.3 6.0 - 8.3 g/dL   Albumin 3.4 (L) 3.5 - 5.2 g/dL   AST 27 0 - 37 U/L   ALT 20 0 - 35 U/L   Alkaline Phosphatase 108 39 - 117 U/L   Total Bilirubin 1.0 0.3 - 1.2 mg/dL   GFR calc non Af Amer 85 (L) >90 mL/min   GFR calc Af Amer >90 >90 mL/min   Anion gap 9 5 - 15  Lipase, blood     Status: None   Collection Time: 02/03/14  8:50 AM  Result Value Ref Range   Lipase 23 11 - 59 U/L  Urinalysis with microscopic     Status: Abnormal   Collection Time: 02/03/14 10:02 AM  Result Value Ref Range   Color, Urine YELLOW YELLOW   APPearance CLEAR CLEAR   Specific Gravity, Urine 1.010 1.005 - 1.030   pH 6.5 5.0 - 8.0   Glucose, UA NEGATIVE NEGATIVE mg/dL   Hgb urine dipstick LARGE (A) NEGATIVE   Bilirubin Urine NEGATIVE NEGATIVE   Ketones, ur 15 (A) NEGATIVE mg/dL   Protein, ur NEGATIVE NEGATIVE mg/dL   Urobilinogen, UA 0.2 0.0 - 1.0 mg/dL   Nitrite NEGATIVE NEGATIVE   Leukocytes, UA NEGATIVE NEGATIVE  Pregnancy, urine     Status: None   Collection Time: 02/03/14 10:02 AM  Result Value Ref Range   Preg Test, Ur NEGATIVE NEGATIVE  Urine microscopic-add on     Status: Abnormal   Collection Time: 02/03/14 10:02 AM  Result Value Ref Range   Squamous Epithelial / LPF FEW (A) RARE   WBC, UA 0-2 <3 WBC/hpf   RBC / HPF 21-50 <3 RBC/hpf   Bacteria, UA FEW (A) RARE  Wet prep, genital     Status: None   Collection Time: 02/03/14  6:15 PM  Result Value Ref Range   Yeast Wet Prep HPF POC NONE SEEN NONE SEEN   Trich, Wet Prep NONE SEEN NONE SEEN   Clue Cells Wet Prep HPF POC NONE SEEN NONE SEEN   WBC, Wet Prep HPF POC NONE SEEN NONE SEEN    Ct Abdomen Pelvis W Contrast  02/03/2014   CLINICAL DATA:  Suprapubic pain for 2 days  EXAM: CT ABDOMEN AND PELVIS WITH CONTRAST  TECHNIQUE:  Multidetector CT imaging of the abdomen and pelvis was performed using the standard protocol following bolus administration of intravenous contrast.  CONTRAST:  75 mL OMNIPAQUE IOHEXOL 300 MG/ML  SOLN  COMPARISON:  02/03/2014  FINDINGS: Lung bases demonstrate some mild dependent atelectatic changes. No focal infiltrate is seen.  Liver demonstrates a hypodensity  near the dome posteriorly best seen on image number 12 of series 2. This may represent a simple cyst or hemangioma although evaluation is limited. No other hepatic abnormality is seen. The spleen, gallbladder, adrenal glands and pancreas appear within normal limits. The kidneys are well visualized bilaterally. Small cyst is noted in the upper pole of the right kidney.  The bladder is well distended. The uterus is well visualized and demonstrates fluid within likely related to the patient's current menstrual status.  Posterior and superior to the uterus, there is significant bowel wall thickening in this rectosigmoid as well as a peripherally enhancing fluid collection which measures approximately 4 x 2.5 cm in greatest dimension. This lies just anterior to the rectum. A second fluid attenuation area is noted which measures 2.3 cm in greatest dimension. This is best seen on image number 67 of series 2 and lies in the expected region of right ovary. Is difficult to assess the etiology of these abnormalities although a tubo-ovarian abscess with localized extension into the pre rectal space is favored. The lack of significant diverticular disease makes diverticulitis a secondary consideration.  IMPRESSION: Significant inflammatory changes in the pelvis with extension into the lower abdomen as described. A tubo-ovarian abscess with localized inflammatory change and a pre-rectal abscess is favored. Surgical consultation is recommended.  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  These results were called by telephone at the time of interpretation on 02/03/2014 at 1:44 pm to  Margarita Grizzleanielle Ray, who verbally acknowledged these results.   Electronically Signed   By: Alcide CleverMark  Lukens M.D.   On: 02/03/2014 13:49   Dg Abd Acute W/chest  02/03/2014   CLINICAL DATA:  One day history of abdominal pain  EXAM: ACUTE ABDOMEN SERIES (ABDOMEN 2 VIEW & CHEST 1 VIEW)  COMPARISON:  None.  FINDINGS: PA chest: There is minimal right base atelectasis. No edema or consolidation. Heart size and pulmonary vascularity are normal. No adenopathy.  Supine and upright abdomen: There is diffuse stool throughout colon. There is several loops of borderline dilated proximal small bowel. No air-fluid levels. No free air. No abnormal calcifications.  IMPRESSION: Several loops of borderline dilated proximal small bowel without air-fluid levels. Question early ileus or enteritis. Obstruction not felt to be likely. Diffuse stool throughout colon. No lung edema or consolidation.   Electronically Signed   By: Bretta BangWilliam  Woodruff M.D.   On: 02/03/2014 09:52   Care of patient turned over to Thressa ShellerHeather Hogan, CNM at 7:40 PM. Patient in ultrasound.  Dorathy KinsmanSMITH, Kofi Murrell 02/03/2014, 7:43 PM

## 2014-02-03 NOTE — ED Notes (Signed)
Contact CT about wait time. Per CT, pt still needs to be provided with oral contrast to drink. Contrast now at pts bedside. Apologized to pt for wait time.

## 2014-02-03 NOTE — ED Notes (Signed)
Patient transported to X-ray 

## 2014-02-04 LAB — GC/CHLAMYDIA PROBE AMP (~~LOC~~) NOT AT ARMC
Chlamydia: NEGATIVE
NEISSERIA GONORRHEA: NEGATIVE

## 2014-02-04 NOTE — Progress Notes (Signed)
Subjective: Patient reports tolerating PO, + flatus and no problems voiding.  Appetite improved  Objective: I have reviewed patient's vital signs, intake and output, medications and labs. Blood pressure 109/63, pulse 87, temperature 98.7 F (37.1 C), temperature source Oral, resp. rate 18, height 5' 2.5" (1.588 m), weight 65.318 kg (144 lb), last menstrual period 01/20/2014, SpO2 96 %.  General: alert, cooperative and no distress Resp: effort normal GI: mild tenderness   Assessment/Plan: PID, possible TOA, responding to ABX, continue Zosyn   LOS: 1 day    Lashundra Shiveley 02/04/2014, 8:53 AM

## 2014-02-06 LAB — CBC WITH DIFFERENTIAL/PLATELET
Basophils Absolute: 0 10*3/uL (ref 0.0–0.1)
Basophils Relative: 1 % (ref 0–1)
Eosinophils Absolute: 0.2 10*3/uL (ref 0.0–0.7)
Eosinophils Relative: 3 % (ref 0–5)
HCT: 28.8 % — ABNORMAL LOW (ref 36.0–46.0)
Hemoglobin: 9.5 g/dL — ABNORMAL LOW (ref 12.0–15.0)
LYMPHS PCT: 36 % (ref 12–46)
Lymphs Abs: 2.8 10*3/uL (ref 0.7–4.0)
MCH: 26.6 pg (ref 26.0–34.0)
MCHC: 33 g/dL (ref 30.0–36.0)
MCV: 80.7 fL (ref 78.0–100.0)
MONOS PCT: 8 % (ref 3–12)
Monocytes Absolute: 0.6 10*3/uL (ref 0.1–1.0)
NEUTROS ABS: 4.2 10*3/uL (ref 1.7–7.7)
Neutrophils Relative %: 52 % (ref 43–77)
PLATELETS: 306 10*3/uL (ref 150–400)
RBC: 3.57 MIL/uL — AB (ref 3.87–5.11)
RDW: 14.4 % (ref 11.5–15.5)
WBC: 7.8 10*3/uL (ref 4.0–10.5)

## 2014-02-06 LAB — SEDIMENTATION RATE: SED RATE: 50 mm/h — AB (ref 0–22)

## 2014-02-06 MED ORDER — POLYETHYLENE GLYCOL 3350 17 G PO PACK
17.0000 g | PACK | Freq: Every day | ORAL | Status: DC
  Administered 2014-02-06 – 2014-02-07 (×2): 17 g via ORAL
  Filled 2014-02-06 (×2): qty 1

## 2014-02-06 MED ORDER — MAGNESIUM HYDROXIDE 400 MG/5ML PO SUSP
30.0000 mL | Freq: Every day | ORAL | Status: DC | PRN
  Administered 2014-02-06: 30 mL via ORAL
  Filled 2014-02-06: qty 30

## 2014-02-06 MED ORDER — BISACODYL 10 MG RE SUPP: 10.0000 mg | Freq: Every day | RECTAL | Status: DC | PRN

## 2014-02-06 MED ORDER — DOXYCYCLINE HYCLATE 100 MG PO TABS
100.0000 mg | ORAL_TABLET | Freq: Two times a day (BID) | ORAL | Status: DC
  Administered 2014-02-06 – 2014-02-07 (×3): 100 mg via ORAL
  Filled 2014-02-06 (×3): qty 1

## 2014-02-06 NOTE — Progress Notes (Signed)
Hosp Day 3 s/p admission for right TOA,   Subjective: Patient reports that she is gradually improving, passing flatus, no bm, feels better than yesterday. Hungry with quick satiety..    Objective: I have reviewed patient's vital signs, intake and output, medications and labs. CBC Latest Ref Rng 02/06/2014 02/03/2014 03/08/2007  WBC 4.0 - 10.5 K/uL 7.8 21.4(H) 13.1(H)  Hemoglobin 12.0 - 15.0 g/dL 1.6(X9.5(L) 11.5(L) 8.4 DELTA CHECK NOTED(L)  Hematocrit 36.0 - 46.0 % 28.8(L) 35.1(L) 24.8(L)  Platelets 150 - 400 K/uL 306 324 237 DELTA CHECK NOTED   GC, CHL negative , RPR negative    General: alert, cooperative and mild distress Resp: clear to auscultation bilaterally GI: normal findings: bowel sounds normal, abnormal findings:  slight bloating, less than yesterday, guarding and slight rebound in rlq only which is described as "better" and softer abdomen Vaginal Bleeding: none LMP Jan 7 Review of u/s : there's a small thick walled cystic structure adjacent to right ovary, with internal debris, consistent with right hydrosalpinx. Maximum diameters 2.4 cm x 1.5 cm by my review. Assessment/Plan: Right TOA/ pyosalpinx Gradual improvement on Zosyn. Will add doxycycline p.o.to current regimen.    LOS: 3 days    Hamsa Laurich V 02/06/2014, 8:40 AM

## 2014-02-06 NOTE — Progress Notes (Signed)
Subjective: Patient reports tolerating PO and + flatus.    Objective: I have reviewed patient's vital signs, medications and microbiology. Temp 99's max Pt sleeping , will reassess later U/s right hydrosalpinx vs pyosalpinx. No CDS collection.  Assessment/Plan: Right TOA vs pyosalpinx.  Continue zosyn    Shamieka Gullo V 02/06/2014, 8:54 AM late note

## 2014-02-07 DIAGNOSIS — N7093 Salpingitis and oophoritis, unspecified: Principal | ICD-10-CM

## 2014-02-07 MED ORDER — IBUPROFEN 600 MG PO TABS: 600.0000 mg | ORAL_TABLET | Freq: Four times a day (QID) | ORAL | Status: DC | PRN

## 2014-02-07 MED ORDER — METRONIDAZOLE 500 MG PO TABS: 500.0000 mg | ORAL_TABLET | Freq: Two times a day (BID) | ORAL | Status: DC

## 2014-02-07 MED ORDER — OXYCODONE-ACETAMINOPHEN 5-325 MG PO TABS: 1.0000 | ORAL_TABLET | ORAL | Status: DC | PRN

## 2014-02-07 MED ORDER — DOXYCYCLINE HYCLATE 100 MG PO TABS: 100.0000 mg | ORAL_TABLET | Freq: Two times a day (BID) | ORAL | Status: DC

## 2014-02-07 NOTE — Progress Notes (Signed)
Ur chart review completed.  

## 2014-02-07 NOTE — Progress Notes (Signed)
Pt ambulated  Out  With family  Teaching complete

## 2014-02-07 NOTE — Discharge Instructions (Signed)
Pelvic Inflammatory Disease °Pelvic inflammatory disease (PID) refers to an infection in some or all of the female organs. The infection can be in the uterus, ovaries, fallopian tubes, or the surrounding tissues in the pelvis. PID can cause abdominal or pelvic pain that comes on suddenly (acute pelvic pain). PID is a serious infection because it can lead to lasting (chronic) pelvic pain or the inability to have children (infertile).  °CAUSES  °The infection is often caused by the normal bacteria found in the vaginal tissues. PID may also be caused by an infection that is spread during sexual contact. PID can also occur following:  °· The birth of a baby.   °· A miscarriage.   °· An abortion.   °· Major pelvic surgery.   °· The use of an intrauterine device (IUD).   °· A sexual assault.   °RISK FACTORS °Certain factors can put a person at higher risk for PID, such as: °· Being younger than 25 years. °· Being sexually active at a young age. °· Using nonbarrier contraception. °· Having multiple sexual partners. °· Having sex with someone who has symptoms of a genital infection. °· Using oral contraception. °Other times, certain behaviors can increase the possibility of getting PID, such as: °· Having sex during your period. °· Using a vaginal douche. °· Having an intrauterine device (IUD) in place. °SYMPTOMS  °· Abdominal or pelvic pain.   °· Fever.   °· Chills.   °· Abnormal vaginal discharge. °· Abnormal uterine bleeding.   °· Unusual pain shortly after finishing your period. °DIAGNOSIS  °Your caregiver will choose some of the following methods to make a diagnosis, such as:  °· Performing a physical exam and history. A pelvic exam typically reveals a very tender uterus and surrounding pelvis.   °· Ordering laboratory tests including a pregnancy test, blood tests, and urine test.  °· Ordering cultures of the vagina and cervix to check for a sexually transmitted infection (STI). °· Performing an ultrasound.    °· Performing a laparoscopic procedure to look inside the pelvis.   °TREATMENT  °· Antibiotic medicines may be prescribed and taken by mouth.   °· Sexual partners may be treated when the infection is caused by a sexually transmitted disease (STD).   °· Hospitalization may be needed to give antibiotics intravenously. °· Surgery may be needed, but this is rare. °It may take weeks until you are completely well. If you are diagnosed with PID, you should also be checked for human immunodeficiency virus (HIV).   °HOME CARE INSTRUCTIONS  °· If given, take your antibiotics as directed. Finish the medicine even if you start to feel better.   °· Only take over-the-counter or prescription medicines for pain, discomfort, or fever as directed by your caregiver.   °· Do not have sexual intercourse until treatment is completed or as directed by your caregiver. If PID is confirmed, your recent sexual partner(s) will need treatment.   °· Keep your follow-up appointments. °SEEK MEDICAL CARE IF:  °· You have increased or abnormal vaginal discharge.   °· You need prescription medicine for your pain.   °· You vomit.   °· You cannot take your medicines.   °· Your partner has an STD.   °SEEK IMMEDIATE MEDICAL CARE IF:  °· You have a fever.   °· You have increased abdominal or pelvic pain.   °· You have chills.   °· You have pain when you urinate.   °· You are not better after 72 hours following treatment.   °MAKE SURE YOU:  °· Understand these instructions. °· Will watch your condition. °· Will get help right away if you are not doing well or get worse. °  Document Released: 12/31/2004 Document Revised: 04/27/2012 Document Reviewed: 12/27/2010 °[employer]® Patient Information ©2015 [employer]. This information is not intended to replace advice given to you by your health care provider. Make sure you discuss any questions you have with your health care provider. ° °

## 2014-02-07 NOTE — Discharge Summary (Signed)
Physician Discharge Summary  Patient ID: [name] MRN: [id number] DOB/AGE: Jan 03, 1971 44 y.o.  Admit date: 02/03/2014 Discharge date: 02/07/2014  Admission Diagnoses: TOA  Discharge Diagnoses: same Active Problems:   Tubo-ovarian abscess   Discharged Condition: good  Hospital Course: patient admitted with abdominal pain and ultrasound finding consistent with a TOA. She received IV antibiotics with significant improvement in her pain. After remaining afebrile for a period of 48 hours prior to discharge, the patient was ready to go home. Discharge instructions were provided. Patient [facility] after completing a 14-day course of doxycycline and flagyl.  Consults: None  Significant Diagnostic Studies: radiology: Ultrasound:    FINDINGS: Uterus  Measurements: 10.8 x 4.7 x 5.0 cm. No fibroids or other mass visualized.  Endometrium  Thickness: Within normal limits at 11 mm. No focal abnormality visualized.  Right ovary  Measurements: 5.2 x 2.9 x 4.6 cm. Normal appearance/no adnexal mass.  Left ovary  Measurements: 4.0 x 2.3 x 2.6 cm. Mildly complex 2.5 cm cyst.  Other findings  Complex fluid collection in the posterior cul-de-sac. The largest collection measures approximately 4.8 x 2.9 cm.  IMPRESSION: 1. Positive for complex fluid layering within the pelvis and cul-de-sac. Differential considerations include small volume hemorrhage related to recent rupture of hemorrhagic cyst versus inflammatory phlegmon/developing abscess in the setting of pelvic inflammatory disease. 2. 2.5 cm mildly complex fluid collection within the right ovary. Differential considerations include debris/infection within a follicular cyst, or less likely an ovarian abscess or minimally hemorrhagic ovarian cyst.  Treatments: antibiotics: vancomycin  Discharge Exam: Blood pressure 99/57, pulse 71, temperature 99.3 F (37.4 C),  temperature source Oral, resp. rate 18, height 5' 2.5" (1.588 m), weight 144 lb (65.318 kg), last menstrual period 01/20/2014, SpO2 99 %. General appearance: alert, cooperative and no distress Resp: clear to auscultation bilaterally Cardio: regular rate and rhythm GI: soft, non-tender; bowel sounds normal; no masses,  no organomegaly Extremities: extremities normal, atraumatic, no cyanosis or edema  Disposition:      Medication List    TAKE these medications        doxycycline 100 MG tablet  Commonly known as:  VIBRA-TABS  Take 1 tablet (100 mg total) by mouth every 12 (twelve) hours.     ibuprofen 600 MG tablet  Commonly known as:  ADVIL,MOTRIN  Take 1 tablet (600 mg total) by mouth every 6 (six) hours as needed (pain).     metroNIDAZOLE 500 MG tablet  Commonly known as:  FLAGYL  Take 1 tablet (500 mg total) by mouth 2 (two) times daily.     oxyCODONE-acetaminophen 5-325 MG per tablet  Commonly known as:  PERCOCET/ROXICET  Take 1-2 tablets by mouth every 3 (three) hours as needed (moderate to severe pain (when tolerating fluids)).           Follow-up Information    [facility]   Specialty:  Obstetrics and Gynecology   Why:  An appointment will be made for you to follow up in 2-3 weeks   Contact information:   [address] [place] [phone]      Signed: Paralee Pendergrass 02/07/2014, 6:44 AM

## 2014-08-03 ENCOUNTER — Ambulatory Visit: Payer: Self-pay | Admitting: Obstetrics

## 2014-10-03 ENCOUNTER — Ambulatory Visit (INDEPENDENT_AMBULATORY_CARE_PROVIDER_SITE_OTHER): Payer: BC Managed Care – PPO | Admitting: Obstetrics

## 2014-10-03 ENCOUNTER — Encounter: Payer: Self-pay | Admitting: Obstetrics

## 2014-10-03 VITALS — BP 115/75 | HR 66 | Temp 97.9°F | Ht 62.0 in | Wt 138.0 lb

## 2014-10-03 DIAGNOSIS — Z01419 Encounter for gynecological examination (general) (routine) without abnormal findings: Secondary | ICD-10-CM

## 2014-10-03 DIAGNOSIS — Z1239 Encounter for other screening for malignant neoplasm of breast: Secondary | ICD-10-CM

## 2014-10-03 DIAGNOSIS — R102 Pelvic and perineal pain: Secondary | ICD-10-CM

## 2014-10-03 NOTE — Progress Notes (Signed)
Subjective:        [name] is a 44 y.o. female here for a routine exam.  Current complaints: Pelvic pain after intercourse.  Brownish discharge after period is over.  Patient states that she had an admission to [facility] in January for severe pelvic pain and was told that she had PID with TOA, lower abdominal and pelvic abscesses.  She was treated with IV antibiotics for 5 days.  Personal health questionnaire:  Is patient Ashkenazi Jewish, have a family history of breast and/or ovarian cancer: no Is there a family history of uterine cancer diagnosed at age < 39, gastrointestinal cancer, urinary tract cancer, family member who is a Personnel officer syndrome-associated carrier: no Is the patient overweight and hypertensive, family history of diabetes, personal history of gestational diabetes, preeclampsia or PCOS: no Is patient over 31, have PCOS,  family history of premature CHD under age 76, diabetes, smoke, have hypertension or peripheral artery disease:  no At any time, has a partner hit, kicked or otherwise hurt or frightened you?: no Over the past 2 weeks, have you felt down, depressed or hopeless?: no Over the past 2 weeks, have you felt little interest or pleasure in doing things?:no   Gynecologic History Patient's last menstrual period was 09/26/2014. Contraception: none Last Pap: 2014. Results were: normal Last mammogram: 2010. Results were: normal  Obstetric History OB History  Gravida Para Term Preterm AB SAB TAB Ectopic Multiple Living  0 0 0 0 0 0 2    # Outcome Date GA Lbr Len/2nd Weight Sex Delivery Anes PTL Lv  2 Term 03/07/07 [redacted]w[redacted]d   F CS-LTranv Spinal  Y  1 Term 05/28/91 [redacted]w[redacted]d   M CS-Classical Spinal  Y      History reviewed. No pertinent past medical history.  Past Surgical History  Procedure Laterality Date  . Cesarean section      No current outpatient prescriptions on file. Allergies  Allergen Reactions  . Latex Rash    Social History  Substance Use  Topics  . Smoking status: Current Every Day Smoker -- 1.00 packs/day  . Smokeless tobacco: Never Used  . Alcohol Use: 0.0 oz/week    0 Standard drinks or equivalent per week     Comment: socially    Family History  Problem Relation Age of Onset  . Diabetes Mother   . Hypertension Mother       Review of Systems  Constitutional: negative for fatigue and weight loss Respiratory: negative for cough and wheezing Cardiovascular: negative for chest pain, fatigue and palpitations Gastrointestinal: negative for abdominal pain and change in bowel habits Musculoskeletal:negative for myalgias Neurological: negative for gait problems and tremors Behavioral/Psych: negative for abusive relationship, depression Endocrine: negative for temperature intolerance   Genitourinary:negative for abnormal menstrual periods, genital lesions, hot flashes.  Positive for pelvic pain after intercourse and brownish vaginal discharge after period Integument/breast: negative for breast lump, breast tenderness, nipple discharge and skin lesion(s)    Objective:       BP 115/75 mmHg  Pulse 66  Temp(Src) 97.9 F (36.6 C)  Ht  (1.575 m)  Wt 138 lb (62.596 kg)  BMI 25.23 kg/m2  LMP 09/26/2014 General:   alert  Skin:   no rash or abnormalities  Lungs:   clear to auscultation bilaterally  Heart:   regular rate and rhythm, S1, S2 normal, no murmur, click, rub or gallop  Breasts:   normal without suspicious masses, skin or nipple changes or  axillary nodes  Abdomen:  normal findings: no organomegaly, soft, non-tender and no hernia  Pelvis:  External genitalia: normal general appearance Urinary system: urethral meatus normal and bladder without fullness, nontender Vaginal: normal without tenderness, induration or masses Cervix: normal appearance Adnexa: normal bimanual exam Uterus: anteverted and mildly tender, normal size   Lab Review Urine pregnancy test Labs reviewed yes Radiologic studies reviewed  yes    Assessment:    Healthy female exam.    ? H/O PID    Plan:   F/U ultrasound ordered  Education reviewed: low fat, low cholesterol diet, safe sex/STD prevention, self breast exams, smoking cessation and weight bearing exercise. Contraception: Considering OCP's, but she has tobacco dependence. Mammogram ordered. Follow up in: 2 weeks.   No orders of the defined types were placed in this encounter.   Orders Placed This Encounter  Procedures  . SureSwab, Vaginosis/Vaginitis Plus  . US Transvaginal Non-OB    Standing Status: Future     Number of Occurrences:      Standing Expiration Date: 12/03/2015    Order Specific Question:  Reason for Exam (SYMPTOM  OR DIAGNOSIS REQUIRED)    Answer:  screening    Order Specific Question:  Preferred imaging location?    Answer:  [facility]  . US Pelvis Complete    Standing Status: Future     Number of Occurrences:      Standing Expiration Date: 12/03/2015    Order Specific Question:  Reason for Exam (SYMPTOM  OR DIAGNOSIS REQUIRED)    Answer:  screening    Order Specific Question:  Preferred imaging location?    Answer:  [facility]  . MM Digital Screening    Standing Status: Future     Number of Occurrences:      Standing Expiration Date: 12/03/2015    Order Specific Question:  Reason for Exam (SYMPTOM  OR DIAGNOSIS REQUIRED)    Answer:  screening    Order Specific Question:  Is the patient pregnant?    Answer:  No    Order Specific Question:  Preferred imaging location?    Answer:  [facility]

## 2014-10-04 ENCOUNTER — Ambulatory Visit (HOSPITAL_COMMUNITY)
Admission: RE | Admit: 2014-10-04 | Discharge: 2014-10-04 | Disposition: A | Discharge status: Home / Self Care | Payer: BC Managed Care – PPO | Source: Ambulatory Visit | Attending: Obstetrics | Admitting: Obstetrics

## 2014-10-04 DIAGNOSIS — R102 Pelvic and perineal pain: Secondary | ICD-10-CM | POA: Insufficient documentation

## 2014-10-04 DIAGNOSIS — Z1239 Encounter for other screening for malignant neoplasm of breast: Secondary | ICD-10-CM

## 2014-10-04 DIAGNOSIS — N8320 Unspecified ovarian cysts: Secondary | ICD-10-CM | POA: Diagnosis not present

## 2014-10-06 LAB — PAP, TP IMAGING W/ HPV RNA, RFLX HPV TYPE 16,18/45: HPV mRNA, High Risk: NOT DETECTED

## 2014-10-08 LAB — SURESWAB, VAGINOSIS/VAGINITIS PLUS
Atopobium vaginae: NOT DETECTED Log (cells/mL)
C. albicans, DNA: DETECTED — AB
C. glabrata, DNA: NOT DETECTED
C. parapsilosis, DNA: NOT DETECTED
C. trachomatis RNA, TMA: NOT DETECTED
C. tropicalis, DNA: NOT DETECTED
LACTOBACILLUS SPECIES: 7.3 Log (cells/mL)
MEGASPHAERA SPECIES: NOT DETECTED Log (cells/mL)
N. gonorrhoeae RNA, TMA: NOT DETECTED
T. vaginalis RNA, QL TMA: NOT DETECTED

## 2014-10-09 ENCOUNTER — Other Ambulatory Visit: Payer: Self-pay | Admitting: Obstetrics

## 2014-10-09 DIAGNOSIS — B3731 Acute candidiasis of vulva and vagina: Secondary | ICD-10-CM

## 2014-10-09 DIAGNOSIS — B373 Candidiasis of vulva and vagina: Secondary | ICD-10-CM

## 2014-10-09 MED ORDER — FLUCONAZOLE 150 MG PO TABS: 150.0000 mg | ORAL_TABLET | Freq: Once | ORAL | Status: DC

## 2014-10-17 ENCOUNTER — Encounter: Payer: Self-pay | Admitting: Obstetrics

## 2014-10-17 ENCOUNTER — Ambulatory Visit (INDEPENDENT_AMBULATORY_CARE_PROVIDER_SITE_OTHER): Payer: BC Managed Care – PPO | Admitting: Obstetrics

## 2014-10-17 VITALS — BP 119/77 | HR 76 | Temp 98.7°F | Wt 139.0 lb

## 2014-10-17 DIAGNOSIS — N939 Abnormal uterine and vaginal bleeding, unspecified: Secondary | ICD-10-CM

## 2014-10-17 DIAGNOSIS — Z30014 Encounter for initial prescription of intrauterine contraceptive device: Secondary | ICD-10-CM

## 2014-10-17 DIAGNOSIS — N946 Dysmenorrhea, unspecified: Secondary | ICD-10-CM

## 2014-10-17 NOTE — Progress Notes (Signed)
Subjective:    [name] is a 44 y.o. female who presents for contraception counseling. The patient has no complaints today. The patient is sexually active. Pertinent past medical history: current smoker.  The information documented in the HPI was reviewed and verified.  Menstrual History: OB History    Gravida Para Term Preterm AB TAB SAB Ectopic Multiple Living   0 0 0 0 0 0 2       Patient's last menstrual period was 10/14/2014 (exact date).   Patient Active Problem List   Diagnosis Date Noted  . Tubo-ovarian abscess 02/03/2014   No past medical history on file.  Past Surgical History  Procedure Laterality Date  . Cesarean section       Current outpatient prescriptions:  .  fluconazole (DIFLUCAN) 150 MG tablet, Take 1 tablet (150 mg total) by mouth once. (Patient not taking: Reported on 10/17/2014), Disp: 1 tablet, Rfl: 2 Allergies  Allergen Reactions  . Latex Rash    Social History  Substance Use Topics  . Smoking status: Current Every Day Smoker -- 1.00 packs/day  . Smokeless tobacco: Never Used  . Alcohol Use: 0.0 oz/week    0 Standard drinks or equivalent per week     Comment: socially    Family History  Problem Relation Age of Onset  . Diabetes Mother   . Hypertension Mother        Review of Systems Constitutional: negative for weight loss Genitourinary: positive for heavy and painful periods   Objective:   BP 119/77 mmHg  Pulse 76  Temp(Src) 98.7 F (37.1 C)  Wt 139 lb (63.05 kg)  LMP 10/14/2014 (Exact Date)  PE:  Deferred  Lab Review Urine pregnancy test Labs reviewed yes Radiologic studies reviewed yes  100% of 10 min visit spent on counseling and coordination of care.   Assessment:    44 y.o., starting IUD, relative contrainidication with tobacco dependence.  She is trying to quit smoking  .  Importance of quitting stressed.  Plan:    All questions answered. Diagnosis explained in detail, including  differential. Discussed healthy lifestyle modifications. Agricultural engineer distributed. Follow up in 1 month.    No orders of the defined types were placed in this encounter.   No orders of the defined types were placed in this encounter.

## 2014-11-09 ENCOUNTER — Encounter: Payer: Self-pay | Admitting: Certified Nurse Midwife

## 2014-11-09 ENCOUNTER — Ambulatory Visit (INDEPENDENT_AMBULATORY_CARE_PROVIDER_SITE_OTHER): Payer: BC Managed Care – PPO | Admitting: Certified Nurse Midwife

## 2014-11-09 ENCOUNTER — Ambulatory Visit (INDEPENDENT_AMBULATORY_CARE_PROVIDER_SITE_OTHER): Payer: BC Managed Care – PPO | Admitting: Obstetrics

## 2014-11-09 VITALS — BP 118/71 | HR 77 | Temp 98.2°F | Wt 138.0 lb

## 2014-11-09 DIAGNOSIS — Z30014 Encounter for initial prescription of intrauterine contraceptive device: Secondary | ICD-10-CM

## 2014-11-09 DIAGNOSIS — Z3202 Encounter for pregnancy test, result negative: Secondary | ICD-10-CM

## 2014-11-09 DIAGNOSIS — Z30430 Encounter for insertion of intrauterine contraceptive device: Secondary | ICD-10-CM | POA: Diagnosis not present

## 2014-11-09 DIAGNOSIS — Z01812 Encounter for preprocedural laboratory examination: Secondary | ICD-10-CM

## 2014-11-09 NOTE — Progress Notes (Signed)
Patient ID: [name], female   DOB: [birth year], 44 y.o.   MRN: [id number]  IUD Procedure Note   DIAGNOSIS: Desires long-term, reversible contraception   PROCEDURE: IUD placement Performing Provider: Orvilla Cornwallachelle Corrinna Karapetyan CNM  Patient counseled prior to procedure. I explained risks and benefits of Mirena IUD, reviewed alternative forms of contraception. Patient stated understanding and consented to continue with procedure.   LMP: 11/08/14 Pregnancy Test: Negative Lot #: [id number] Expiration Date: 02/19   IUD type: [ X ] Mirena   [   ] Paraguard    PROCEDURE:  Timeout procedure was performed to ensure right patient and right site.  A bimanual exam was performed to determine the position of the uterus, anteverted. The speculum was placed. The vagina and cervix was sterilized in the usual manner and sterile technique was maintained throughout the course of the procedure. A single toothed tenaculum was applied to the posterior lip of the cervix and gentle traction applied. The depth of the uterus was sounded to 9 cm. With gentle traction on the tenaculum, the IUD was inserted to the appropriate depth and inserted without difficulty.  The string was cut to an estimated 4 cm length. Bleeding was minimal. The patient tolerated the procedure well.   Follow up: The patient tolerated the procedure well without complications.  Standard post-procedure care is explained and return precautions are given.  Orvilla Cornwallachelle Halaina Vanduzer CNM

## 2014-12-07 ENCOUNTER — Ambulatory Visit: Payer: BC Managed Care – PPO | Admitting: Certified Nurse Midwife

## 2014-12-09 ENCOUNTER — Ambulatory Visit: Payer: BC Managed Care – PPO | Admitting: Certified Nurse Midwife

## 2014-12-15 ENCOUNTER — Encounter: Payer: Self-pay | Admitting: Certified Nurse Midwife

## 2014-12-15 ENCOUNTER — Ambulatory Visit (INDEPENDENT_AMBULATORY_CARE_PROVIDER_SITE_OTHER): Payer: BC Managed Care – PPO | Admitting: Certified Nurse Midwife

## 2014-12-15 VITALS — BP 114/72 | HR 80 | Temp 98.2°F | Wt 145.0 lb

## 2014-12-15 DIAGNOSIS — L309 Dermatitis, unspecified: Secondary | ICD-10-CM | POA: Diagnosis not present

## 2014-12-15 DIAGNOSIS — N904 Leukoplakia of vulva: Secondary | ICD-10-CM

## 2014-12-15 DIAGNOSIS — N760 Acute vaginitis: Secondary | ICD-10-CM

## 2014-12-15 MED ORDER — TRIAMCINOLONE ACETONIDE 0.5 % EX OINT: 1.0000 "application " | TOPICAL_OINTMENT | Freq: Two times a day (BID) | CUTANEOUS | Status: DC

## 2014-12-15 MED ORDER — FLUCONAZOLE 200 MG PO TABS: 200.0000 mg | ORAL_TABLET | Freq: Once | ORAL | Status: DC

## 2014-12-15 MED ORDER — CLOBETASOL PROPIONATE 0.05 % EX OINT: 1.0000 "application " | TOPICAL_OINTMENT | Freq: Two times a day (BID) | CUTANEOUS | Status: DC

## 2014-12-15 MED ORDER — HYDROCORTISONE 1 % EX CREA: 1.0000 "application " | TOPICAL_CREAM | Freq: Two times a day (BID) | CUTANEOUS | Status: DC

## 2014-12-15 NOTE — Progress Notes (Signed)
Patient ID: [name], female   DOB: May 02, 1970, 44 y.o.   MRN: [id number]   Chief Complaint  Patient presents with  . Follow-up    IUD Placement    HPI [name] is a 44 y.o. female.  Here for f/u on IUD.  Is very pleased with the IUD thus far.  Is having brown spotting currently.  Does have labial irritation for several weeks, has tried Diflucan without success.  States it helped a little bit.  Desires to have the itching stop.   Does have a hx of eczema, has never had this problem before.  HPI  History reviewed. No pertinent past medical history.  Past Surgical History  Procedure Laterality Date  . Cesarean section      Family History  Problem Relation Age of Onset  . Diabetes Mother   . Hypertension Mother     Social History Social History  Substance Use Topics  . Smoking status: Current Every Day Smoker -- 1.00 packs/day  . Smokeless tobacco: Never Used  . Alcohol Use: 0.0 oz/week    0 Standard drinks or equivalent per week     Comment: socially    Allergies  Allergen Reactions  . Latex Rash    Current Outpatient Prescriptions  Medication Sig Dispense Refill  . clobetasol ointment (TEMOVATE) 0.05 % Apply 1 application topically 2 (two) times daily. 30 g 0  . fluconazole (DIFLUCAN) 200 MG tablet Take 1 tablet (200 mg total) by mouth once. Repeat in 48-72 hours. 2 tablet 0  . hydrocortisone cream 1 % Apply 1 application topically 2 (two) times daily. 30 g 0  . triamcinolone ointment (KENALOG) 0.5 % Apply 1 application topically 2 (two) times daily. 30 g 0   No current facility-administered medications for this visit.    Review of Systems Review of Systems Constitutional: negative for fatigue and weight loss Respiratory: negative for cough and wheezing Cardiovascular: negative for chest pain, fatigue and palpitations Gastrointestinal: negative for abdominal pain and change in bowel habits Genitourinary: + labial puritius Integument/breast:  negative for nipple discharge Musculoskeletal:negative for myalgias Neurological: negative for gait problems and tremors Behavioral/Psych: negative for abusive relationship, depression Endocrine: negative for temperature intolerance     Blood pressure 114/72, pulse 80, temperature 98.2 F (36.8 C), weight 145 lb (65.772 kg), last menstrual period 12/06/2014.  Physical Exam Physical Exam General:   alert  Skin:   no rash or abnormalities  Lungs:   clear to auscultation bilaterally  Heart:   regular rate and rhythm, S1, S2 normal, no murmur, click, rub or gallop  Breasts:   deferred  Abdomen:  normal findings: no organomegaly, soft, non-tender and no hernia  Pelvis:  External genitalia: normal general appearance, + white patches on labia Urinary system: urethral meatus normal and bladder without fullness, nontender Vaginal: normal without tenderness, induration or masses Cervix: normal appearance, strings present Adnexa: normal bimanual exam Uterus: anteverted and non-tender, normal size    50% of 15 min visit spent on counseling and coordination of care.   Data Reviewed Previous medical hx, meds, labs  Assessment     IUD check up: strings present Labia eczema versus lichen sclerosus     Plan    Orders Placed This Encounter  Procedures  . SureSwab, Vaginosis/Vaginitis Plus   Meds ordered this encounter  Medications  . triamcinolone ointment (KENALOG) 0.5 %    Sig: Apply 1 application topically 2 (two) times daily.    Dispense:  30 g  Refill:  0  . hydrocortisone cream 1 %    Sig: Apply 1 application topically 2 (two) times daily.    Dispense:  30 g    Refill:  0  . fluconazole (DIFLUCAN) 200 MG tablet    Sig: Take 1 tablet (200 mg total) by mouth once. Repeat in 48-72 hours.    Dispense:  2 tablet    Refill:  0  . clobetasol ointment (TEMOVATE) 0.05 %    Sig: Apply 1 application topically 2 (two) times daily.    Dispense:  30 g    Refill:  0    Possible  management options include: biopsy for lichen if problem continues Follow up as needed or in 1 year for annual exam.

## 2014-12-18 LAB — SURESWAB, VAGINOSIS/VAGINITIS PLUS
Atopobium vaginae: NOT DETECTED Log (cells/mL)
C. GLABRATA, DNA: NOT DETECTED
C. TROPICALIS, DNA: NOT DETECTED
C. albicans, DNA: DETECTED — AB
C. parapsilosis, DNA: NOT DETECTED
C. trachomatis RNA, TMA: NOT DETECTED
Gardnerella vaginalis: <4.7 Log (cells/mL)
LACTOBACILLUS SPECIES: 7 Log (cells/mL)
MEGASPHAERA SPECIES: NOT DETECTED Log (cells/mL)
N. GONORRHOEAE RNA, TMA: NOT DETECTED
T. vaginalis RNA, QL TMA: NOT DETECTED

## 2014-12-20 ENCOUNTER — Other Ambulatory Visit: Payer: Self-pay | Admitting: Certified Nurse Midwife

## 2015-05-12 ENCOUNTER — Other Ambulatory Visit: Payer: Self-pay | Admitting: Certified Nurse Midwife

## 2015-08-19 ENCOUNTER — Other Ambulatory Visit: Payer: Self-pay | Admitting: Obstetrics

## 2016-02-04 IMAGING — CT CT ABD-PELV W/ CM
2 of 5 series · 15 of 46 positions shown, 17 images · IV contrast (Omni 300)
Comparison: 02/03/2014

CLINICAL DATA: Suprapubic pain for 2 days

EXAM:
CT ABDOMEN AND PELVIS WITH CONTRAST
TECHNIQUE: Multidetector CT imaging of the abdomen and pelvis was performed
using the standard protocol following bolus administration of
intravenous contrast.
CONTRAST:  75 mL OMNIPAQUE IOHEXOL 300 MG/ML  SOLN

[Series 2: abd/ pelvis 5.0 i30f 1 · axial · 0.70mm/px · z∈[+822,+1222]mm · 12 of 90 slices shown, 14 images]
[im 5/90  soft-tissue]
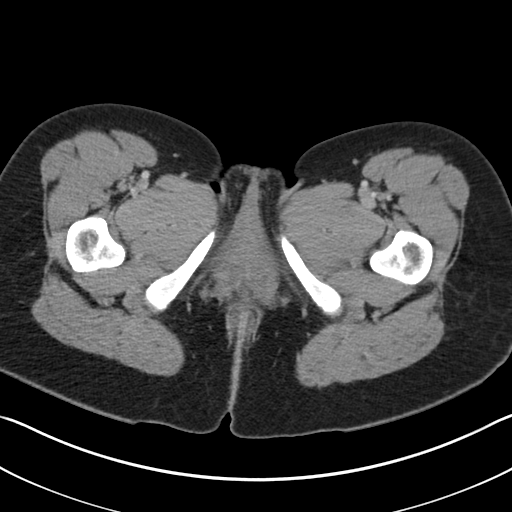
[im 5/90  bone]
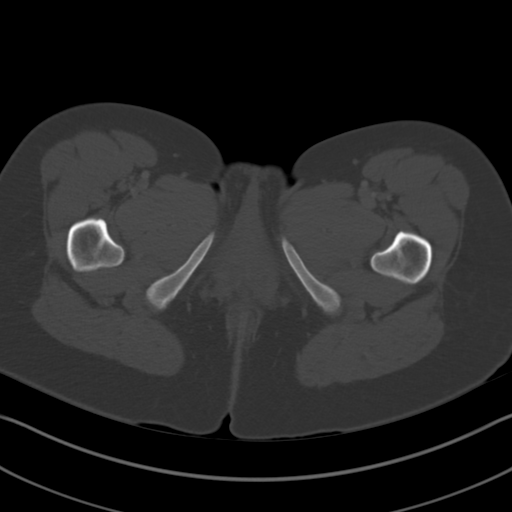
[im 14/90  soft-tissue]
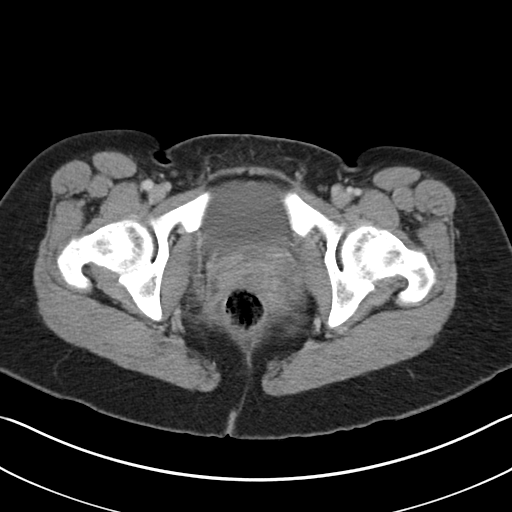
[im 18/90  soft-tissue]
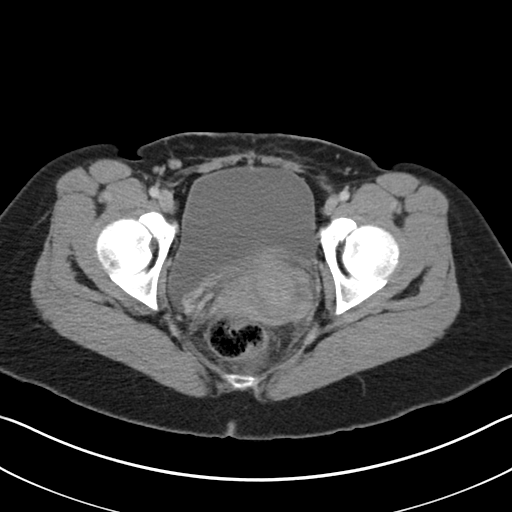
[im 27/90  soft-tissue]
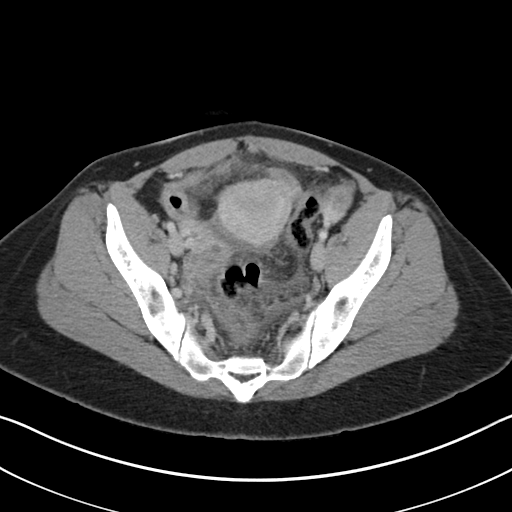
[im 36/90  soft-tissue]
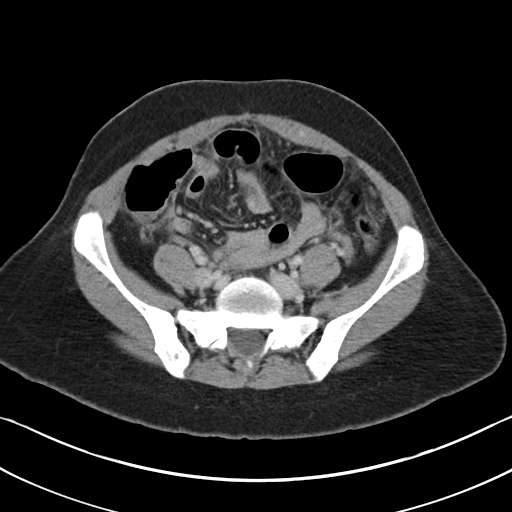
[im 41/90  soft-tissue]
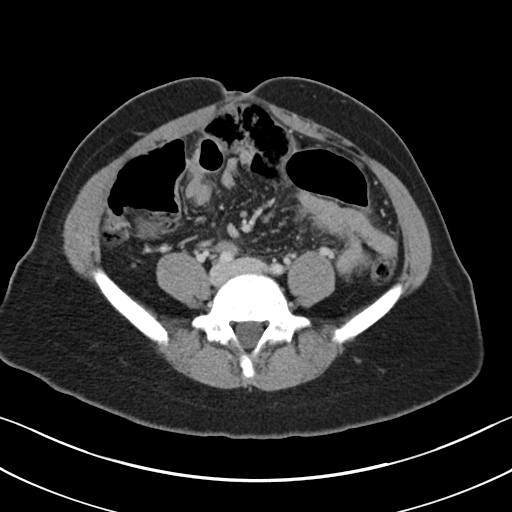
[im 49/90  soft-tissue]
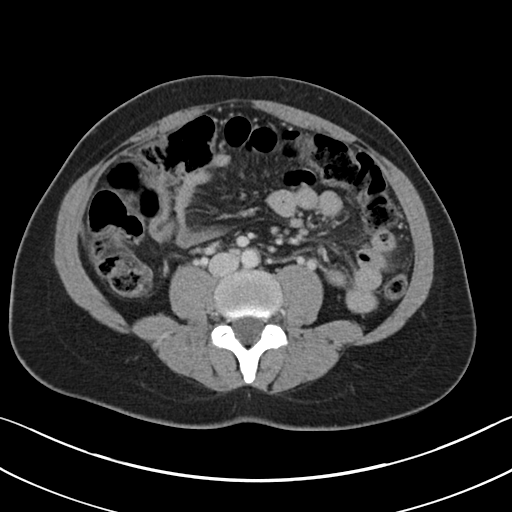
[im 54/90  soft-tissue]
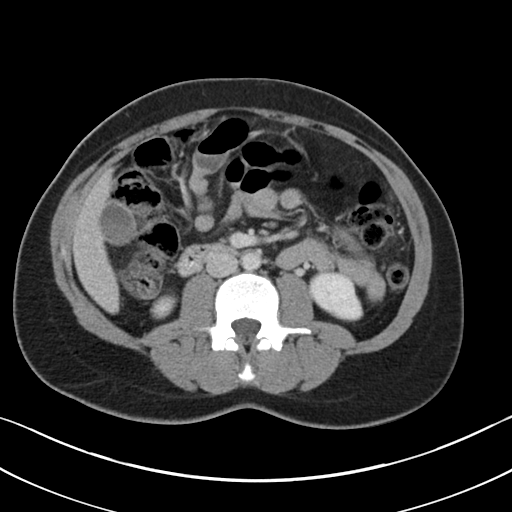
[im 63/90  soft-tissue]
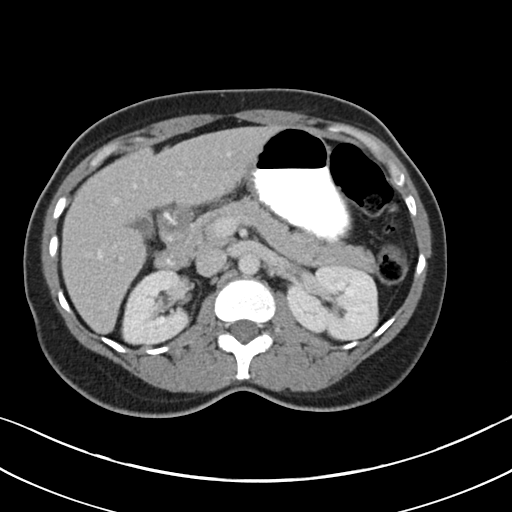
[im 63/90  bone]
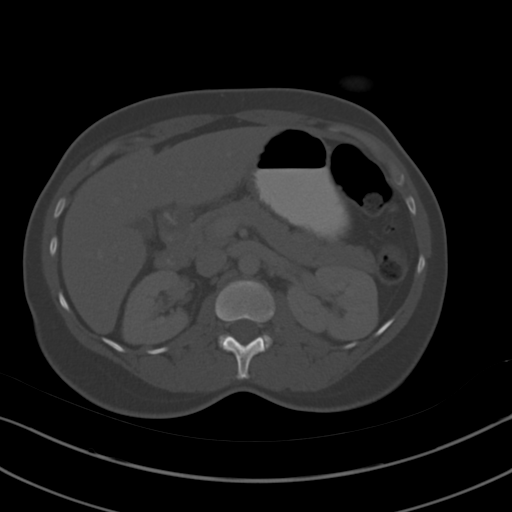
[im 72/90  soft-tissue]
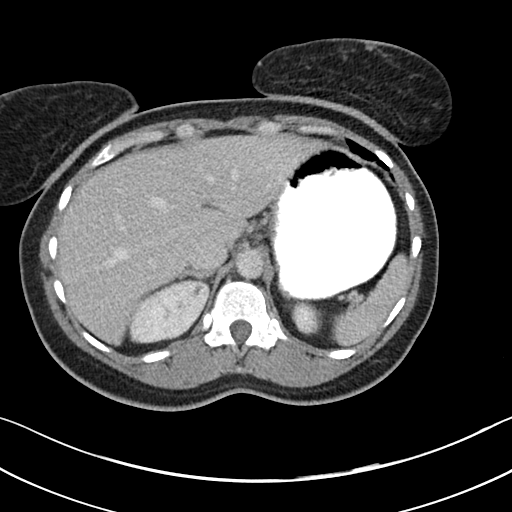
[im 76/90  soft-tissue]
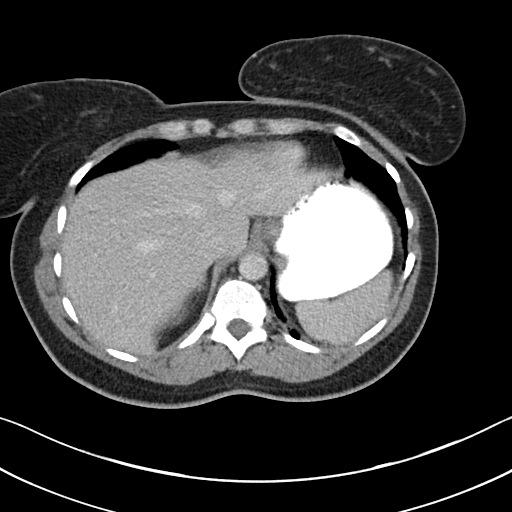
[im 85/90  soft-tissue]
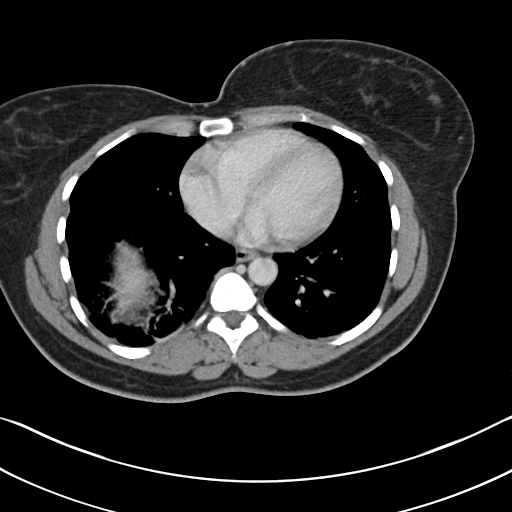

[Series 5: coronals · coronal · 0.60mm/px · 3 of 143 slices shown]
[im 48/143  soft-tissue]
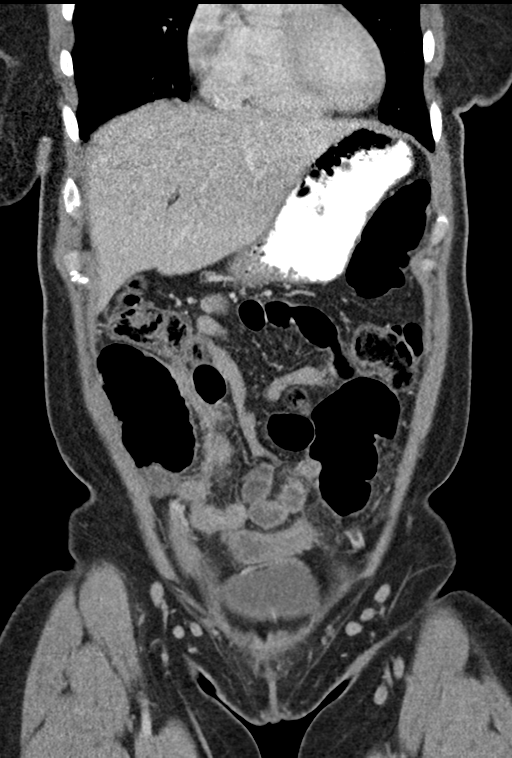
[im 64/143  soft-tissue]
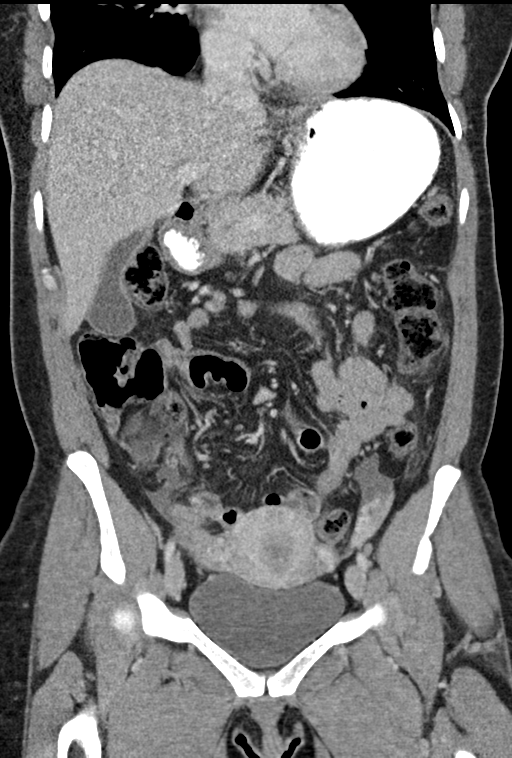
[im 79/143  soft-tissue]
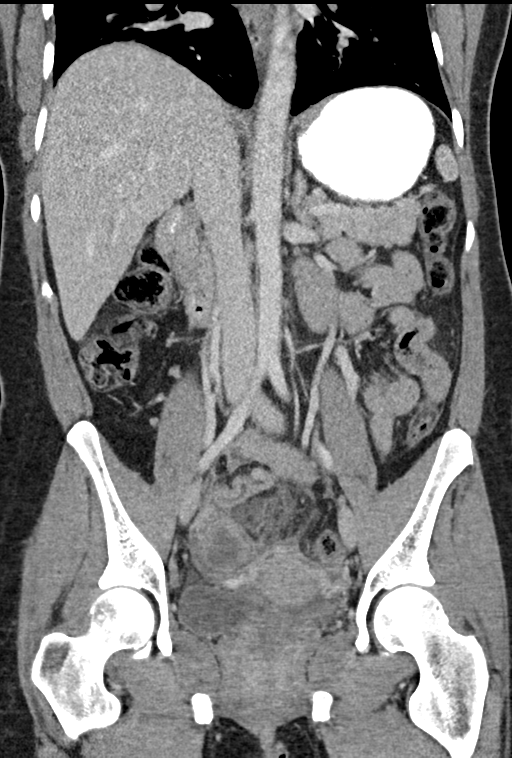

[15 of 46 positions shown; findings below may reference images not displayed]

FINDINGS: Lung bases demonstrate some mild dependent atelectatic changes. No
focal infiltrate is seen.

Liver demonstrates a hypodensity near the dome posteriorly best seen
on image number 12 of series 2. This may represent a simple cyst or
hemangioma although evaluation is limited. No other hepatic
abnormality is seen. The spleen, gallbladder, adrenal glands and
pancreas appear within normal limits. The kidneys are well
visualized bilaterally. Small cyst is noted in the upper pole of the
right kidney.

The bladder is well distended. The uterus is well visualized and
demonstrates fluid within likely related to the patient's current
menstrual status.

Posterior and superior to the uterus, there is significant bowel
wall thickening in this rectosigmoid as well as a peripherally
enhancing fluid collection which measures approximately 4 x 2.5 cm
in greatest dimension. This lies just anterior to the rectum. A
second fluid attenuation area is noted which measures 2.3 cm in
greatest dimension. This is best seen on image number 67 of series 2
and lies in the expected region of right ovary. Is difficult to
assess the etiology of these abnormalities although a tubo-ovarian
abscess with localized extension into the pre rectal space is
favored. The lack of significant diverticular disease makes
diverticulitis a secondary consideration.
IMPRESSION: Significant inflammatory changes in the pelvis with extension into
the lower abdomen as described. A tubo-ovarian abscess with
localized inflammatory change and a pre-rectal abscess is favored.
Surgical consultation is recommended.

Likely hepatic and renal cysts.

These results were called by telephone at the time of interpretation
on 02/03/2014 at [DATE] to Abimelk Tiger, who verbally acknowledged
these results.

## 2016-02-04 IMAGING — CR DG ABDOMEN ACUTE W/ 1V CHEST
3 series · 3 of 3 positions shown · non-contrast
Comparison: None.

CLINICAL DATA: One day history of abdominal pain

EXAM:
ACUTE ABDOMEN SERIES (ABDOMEN 2 VIEW & CHEST 1 VIEW)

[w chest pa]
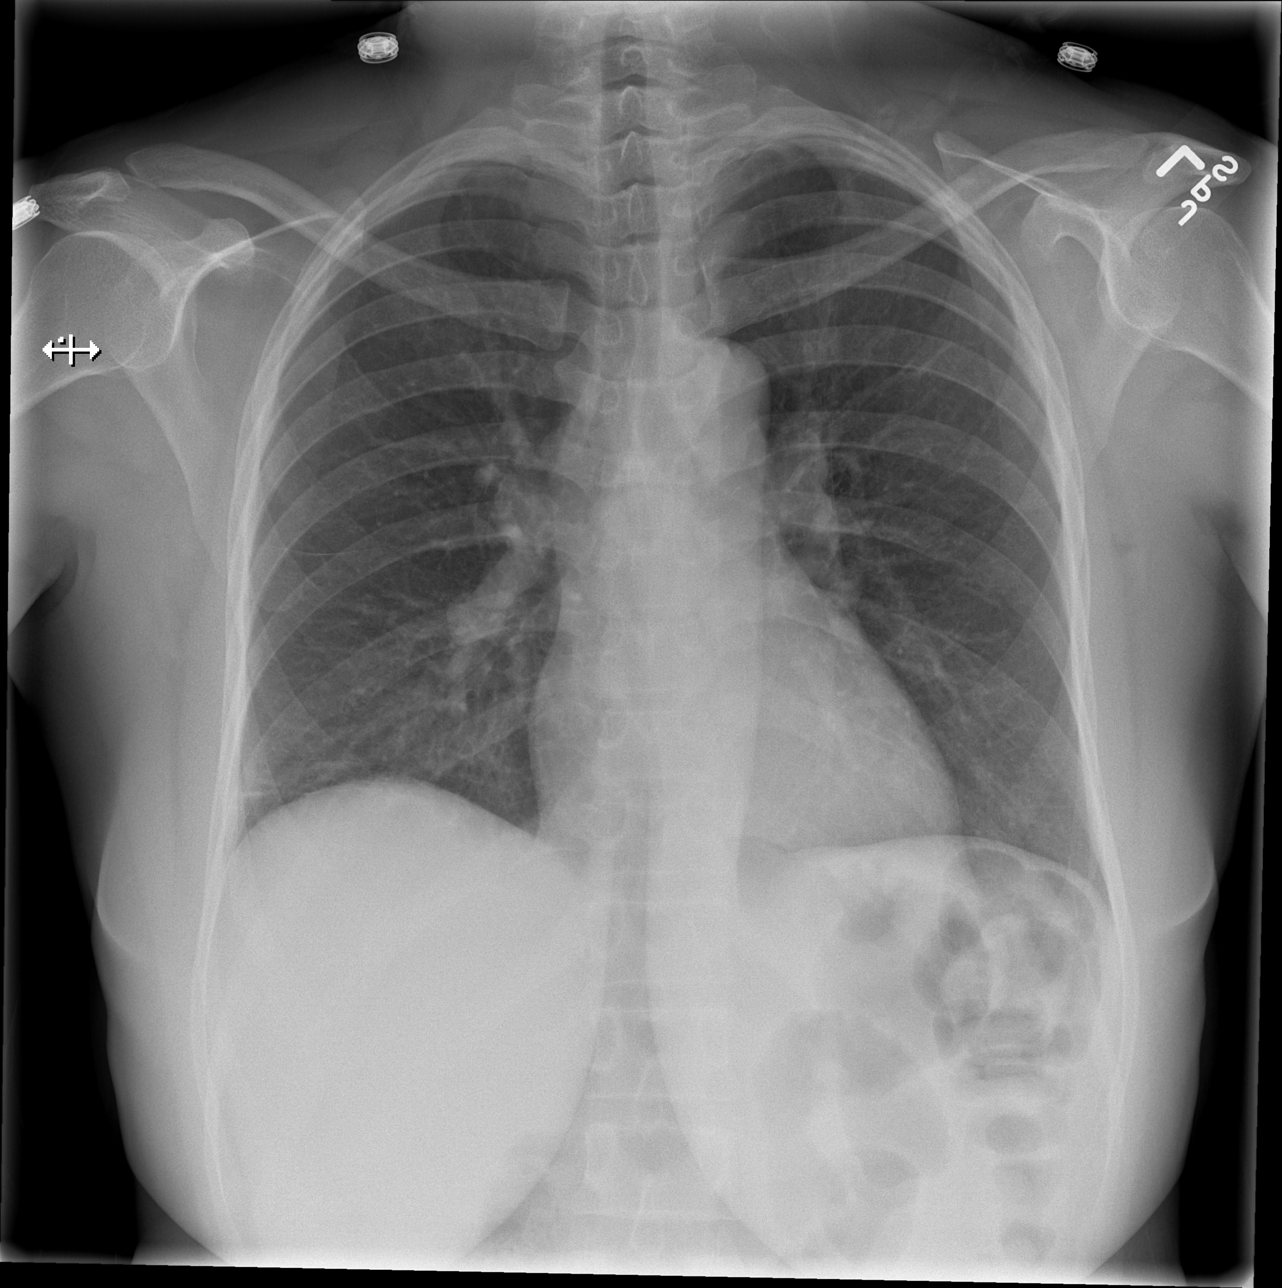

[w abdomen upright]
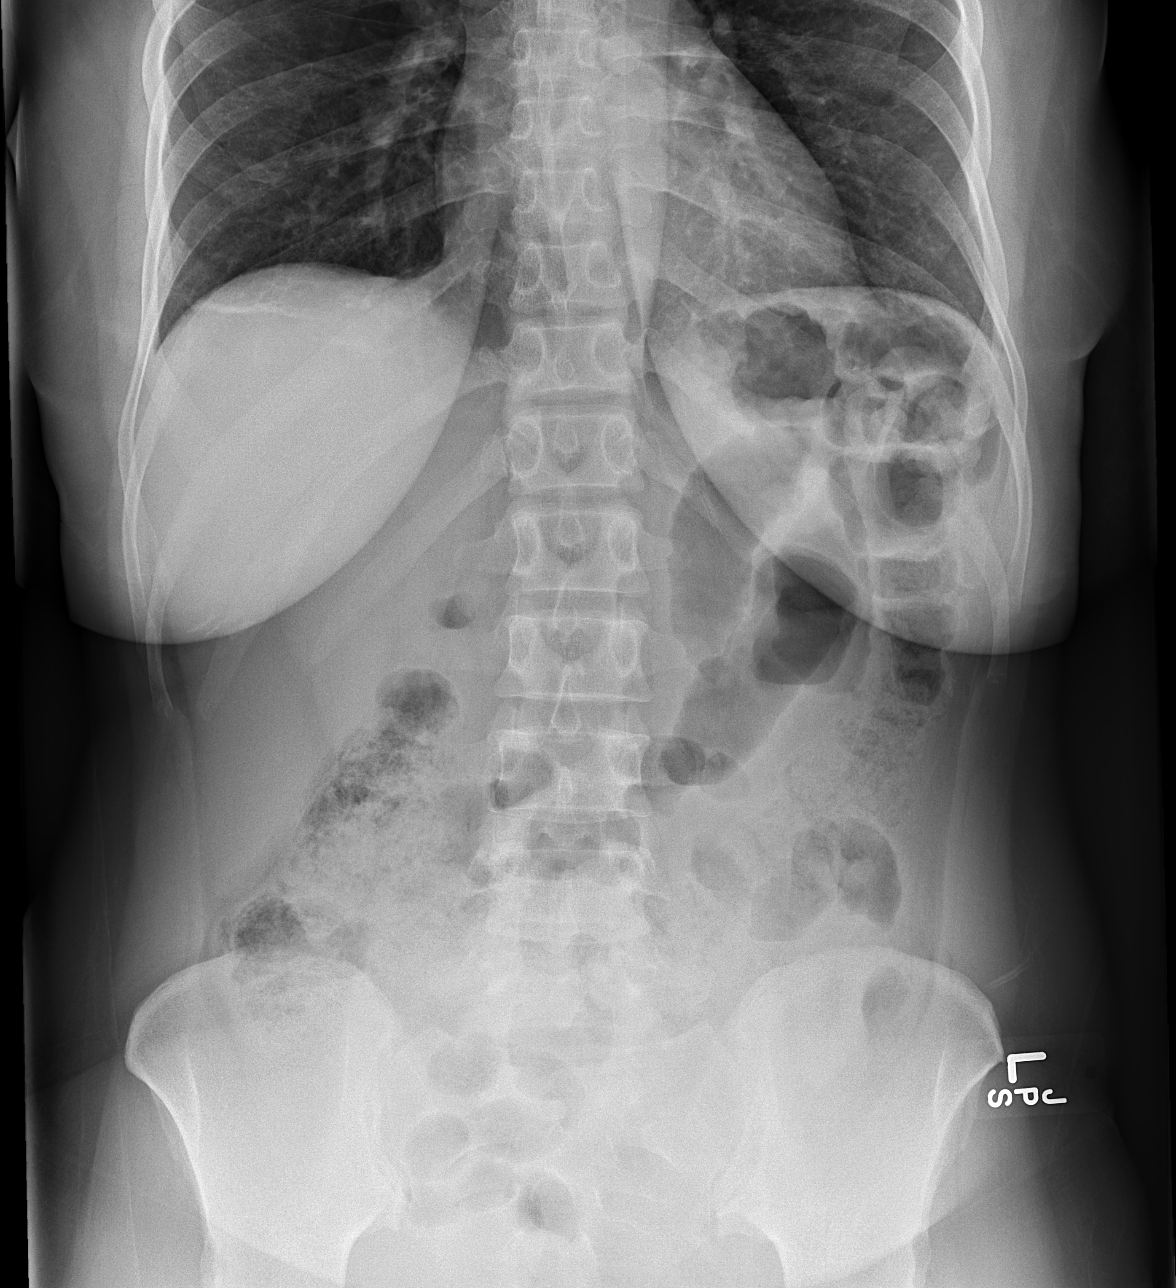

[t abdomen supine]
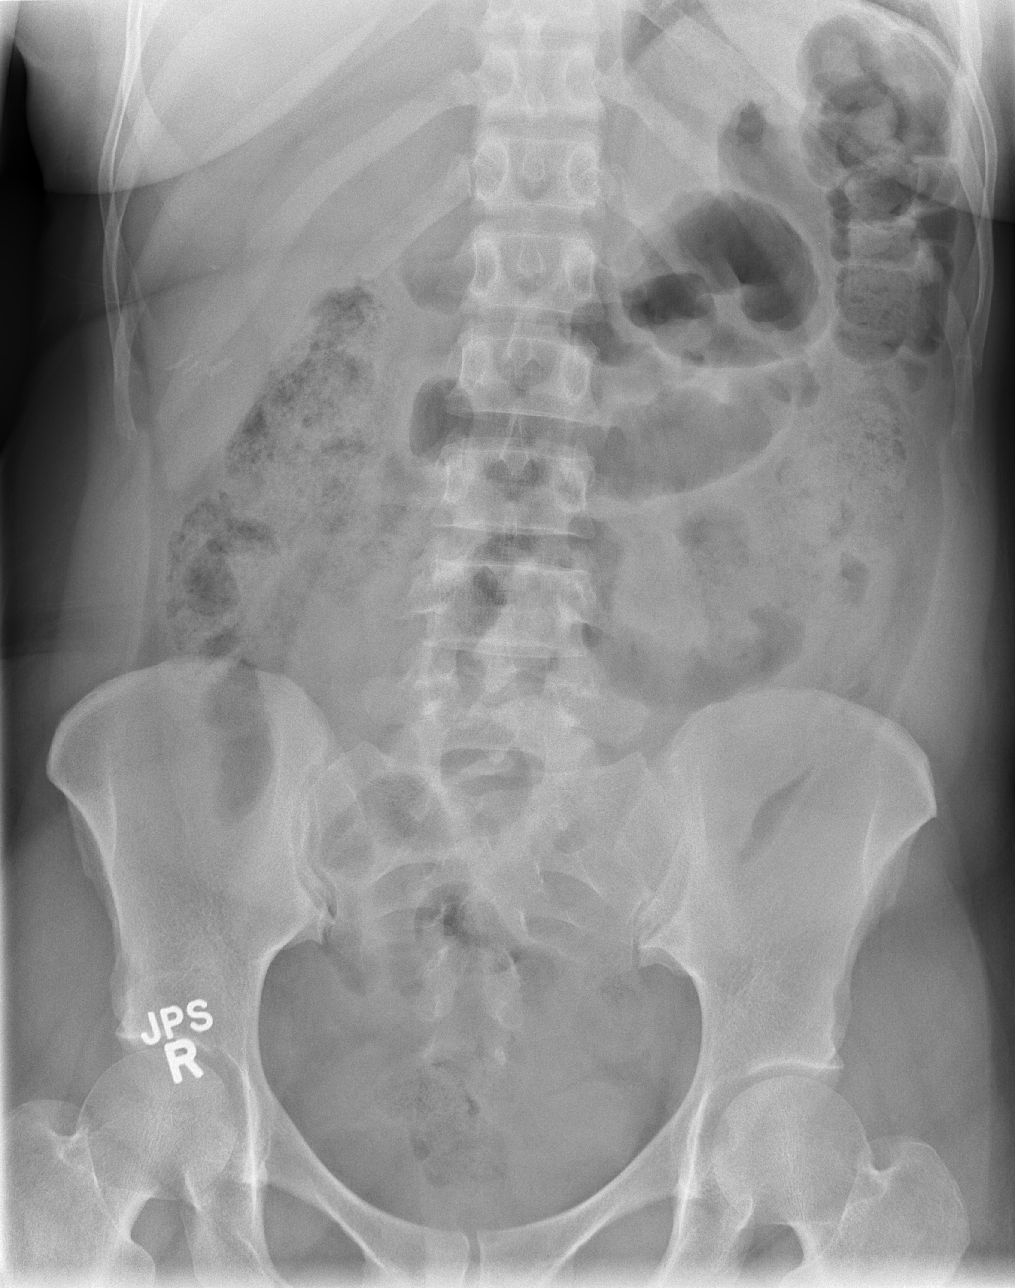

[3 of 3 positions shown; findings below may reference images not displayed]

FINDINGS: PA chest: There is minimal right base atelectasis. No edema or
consolidation. Heart size and pulmonary vascularity are normal. No
adenopathy.

Supine and upright abdomen: There is diffuse stool throughout colon.
There is several loops of borderline dilated proximal small bowel.
No air-fluid levels. No free air. No abnormal calcifications.
IMPRESSION: Several loops of borderline dilated proximal small bowel without
air-fluid levels. Question early ileus or enteritis. Obstruction not
felt to be likely. Diffuse stool throughout colon. No lung edema or
consolidation.

## 2016-03-19 ENCOUNTER — Encounter: Payer: Self-pay | Admitting: Obstetrics

## 2016-03-19 ENCOUNTER — Ambulatory Visit (INDEPENDENT_AMBULATORY_CARE_PROVIDER_SITE_OTHER): Payer: BC Managed Care – PPO | Admitting: Obstetrics

## 2016-03-19 VITALS — BP 151/85 | HR 89 | Ht 64.0 in | Wt 165.0 lb

## 2016-03-19 DIAGNOSIS — Z1239 Encounter for other screening for malignant neoplasm of breast: Secondary | ICD-10-CM

## 2016-03-19 DIAGNOSIS — Z0000 Encounter for general adult medical examination without abnormal findings: Secondary | ICD-10-CM | POA: Diagnosis not present

## 2016-03-19 DIAGNOSIS — Z1211 Encounter for screening for malignant neoplasm of colon: Secondary | ICD-10-CM | POA: Insufficient documentation

## 2016-03-19 DIAGNOSIS — Z01419 Encounter for gynecological examination (general) (routine) without abnormal findings: Secondary | ICD-10-CM

## 2016-03-19 DIAGNOSIS — Z124 Encounter for screening for malignant neoplasm of cervix: Secondary | ICD-10-CM

## 2016-03-19 DIAGNOSIS — T8332XD Displacement of intrauterine contraceptive device, subsequent encounter: Secondary | ICD-10-CM

## 2016-03-19 DIAGNOSIS — B369 Superficial mycosis, unspecified: Secondary | ICD-10-CM

## 2016-03-19 MED ORDER — CLOTRIMAZOLE 1 % EX CREA: 1.0000 "application " | TOPICAL_CREAM | Freq: Two times a day (BID) | CUTANEOUS | 4 refills | Status: DC

## 2016-03-19 NOTE — Progress Notes (Signed)
Subjective:        [name] is a 46 y.o. female here for a routine exam.  Current complaints: Occasional cramping.  Has a Jock Itch type rash and itching.  Was prescribed Kenalog but no resolution of symptoms.  Personal health questionnaire:  Is patient Ashkenazi Jewish, have a family history of breast and/or ovarian cancer: no Is there a family history of uterine cancer diagnosed at age < 3250, gastrointestinal cancer, urinary tract cancer, family member who is a Personnel officerLynch syndrome-associated carrier: no Is the patient overweight and hypertensive, family history of diabetes, personal history of gestational diabetes, preeclampsia or PCOS: no Is patient over 4455, have PCOS,  family history of premature CHD under age 765, diabetes, smoke, have hypertension or peripheral artery disease:  no At any time, has a partner hit, kicked or otherwise hurt or frightened you?: no Over the past 2 weeks, have you felt down, depressed or hopeless?: no Over the past 2 weeks, have you felt little interest or pleasure in doing things?:no   Gynecologic History No LMP recorded. Patient is not currently having periods (Reason: Irregular Periods). Contraception: IUD Last Pap: 2016. Results were: normal Last mammogram: 2016. Results were: normal  Obstetric History OB History  Gravida Para Term Preterm AB Living  2 2 2  0 0 2  SAB TAB Ectopic Multiple Live Births  0 0 0 0 2    # Outcome Date GA Lbr Len/2nd Weight Sex Delivery Anes PTL Lv  2 Term 03/07/07 [id number]   F CS-LTranv Spinal  LIV  1 Term 05/28/91 1825w0d   M CS-Classical Spinal  LIV      History reviewed. No pertinent past medical history.  Past Surgical History:  Procedure Laterality Date  . CESAREAN SECTION       Current Outpatient Prescriptions:  .  clobetasol ointment (TEMOVATE) 0.05 %, Apply 1 application topically 2 (two) times daily. (Patient not taking: Reported on 03/19/2016), Disp: 30 g, Rfl: 0 .  clotrimazole (LOTRIMIN) 1 % cream,  Apply 1 application topically 2 (two) times daily., Disp: 113 g, Rfl: 4 .  fluconazole (DIFLUCAN) 200 MG tablet, Take 1 tablet (200 mg total) by mouth once. Repeat in 48-72 hours. (Patient not taking: Reported on 03/19/2016), Disp: 2 tablet, Rfl: 0 .  hydrocortisone cream 1 %, APPLY TO AFFECTED AREA TWICE A DAY (Patient not taking: Reported on 03/19/2016), Disp: 28 g, Rfl: 0 .  triamcinolone ointment (KENALOG) 0.5 %, APPLY TO AFFECTED AREA TWICE A DAY (Patient not taking: Reported on 03/19/2016), Disp: 30 g, Rfl: 0 Allergies  Allergen Reactions  . Latex Rash    Social History  Substance Use Topics  . Smoking status: Current Every Day Smoker    Packs/day: 1.00  . Smokeless tobacco: Never Used  . Alcohol use 0.0 oz/week     Comment: socially    Family History  Problem Relation Age of Onset  . Diabetes Mother   . Hypertension Mother   . Fibroids Maternal Grandmother       Review of Systems  Constitutional: negative for fatigue and weight loss Respiratory: negative for cough and wheezing Cardiovascular: negative for chest pain, fatigue and palpitations Gastrointestinal: negative for abdominal pain and change in bowel habits Musculoskeletal:negative for myalgias Neurological: negative for gait problems and tremors Behavioral/Psych: negative for abusive relationship, depression Endocrine: negative for temperature intolerance    Genitourinary:negative for abnormal menstrual periods, genital lesions, hot flashes, sexual problems and vaginal discharge Integument/breast: negative for breast lump, breast tenderness,  nipple discharge and skin lesion(s)    Objective:       BP (!) 151/85   Pulse 89   Ht 5\' 4"  (1.626 m)   Wt 165 lb (74.8 kg)   BMI 28.32 kg/m  General:   alert  Skin:   no rash or abnormalities  Lungs:   clear to auscultation bilaterally  Heart:   regular rate and rhythm, S1, S2 normal, no murmur, click, rub or gallop  Breasts:   normal without suspicious masses, skin or  nipple changes or axillary nodes  Abdomen:  normal findings: no organomegaly, soft, non-tender and no hernia  Pelvis:  External genitalia: normal general appearance Urinary system: urethral meatus normal and bladder without fullness, nontender Vaginal: normal without tenderness, induration or masses Cervix: normal appearance.  No IUD string visible. Adnexa: normal bimanual exam Uterus: anteverted and non-tender, normal size   Lab Review Urine pregnancy test Labs reviewed yes Radiologic studies reviewed yes  50% of 20 min visit spent on counseling and coordination of care.    Assessment:    Healthy female exam.    Contraceptive Management.  Would like to continue IUD.  IUD strings lost.   Plan:   Ultrasound ordered for IUD Placement  Education reviewed: calcium supplements, depression evaluation, safe sex/STD prevention, self breast exams and weight bearing exercise. Contraception: IUD. Mammogram ordered. Follow up in: 3 months.   Meds ordered this encounter  Medications  . clotrimazole (LOTRIMIN) 1 % cream    Sig: Apply 1 application topically 2 (two) times daily.    Dispense:  113 g    Refill:  4   Orders Placed This Encounter  Procedures  . US Transvaginal Non-OB    Standing Status:   Future    Standing Expiration Date:   05/19/2017    Order Specific Question:   Reason for Exam (SYMPTOM  OR DIAGNOSIS REQUIRED)    Answer:   iud    Order Specific Question:   Preferred imaging location?    Answer:   [facility]  . MM Digital Screening    [facility]:10/04/2014 LS AND Crista Elliot [phone] W/ EPIC ORDER    Standing Status:   Future    Standing Expiration Date:   05/19/2017    Order Specific Question:   Reason for Exam (SYMPTOM  OR DIAGNOSIS REQUIRED)    Answer:   Screening    Order Specific Question:   Is the patient pregnant?    Answer:   No    Order Specific Question:   Preferred imaging location?    Answer:   [facility]  . US Pelvis Complete    Standing  Status:   Future    Standing Expiration Date:   05/19/2017    Order Specific Question:   Reason for Exam (SYMPTOM  OR DIAGNOSIS REQUIRED)    Answer:   IUD Placement    Order Specific Question:   Preferred imaging location?    Answer:   [facility]  . Hepatitis B surface antigen  . Hepatitis C antibody  . HIV antibody  . RPR      Patient ID: [name], female   DOB: [birth year], 46 y.o.   MRN: [id number]

## 2016-03-19 NOTE — Progress Notes (Signed)
Pt presents for IUD removal d/t cramping, annual, and pap smear. Pt is leaning towards Depo instead. Changed mind, no removal today per pt.

## 2016-03-20 LAB — CERVICOVAGINAL ANCILLARY ONLY
Bacterial vaginitis: NEGATIVE
Candida vaginitis: NEGATIVE
Chlamydia: NEGATIVE
Neisseria Gonorrhea: NEGATIVE
Trichomonas: NEGATIVE

## 2016-03-20 LAB — RPR: RPR: NONREACTIVE

## 2016-03-20 LAB — HEPATITIS C ANTIBODY: Hep C Virus Ab: <0.1 s/co ratio (ref 0.0–0.9)

## 2016-03-20 LAB — HIV ANTIBODY (ROUTINE TESTING W REFLEX): HIV Screen 4th Generation wRfx: NONREACTIVE

## 2016-03-20 LAB — HEPATITIS B SURFACE ANTIGEN: HEP B S AG: NEGATIVE

## 2016-03-22 LAB — CYTOLOGY - PAP
Diagnosis: NEGATIVE
HPV: NOT DETECTED

## 2016-03-27 ENCOUNTER — Ambulatory Visit (HOSPITAL_COMMUNITY)
Admission: RE | Admit: 2016-03-27 | Discharge: 2016-03-27 | Disposition: A | Discharge status: Home / Self Care | Payer: BC Managed Care – PPO | Source: Ambulatory Visit | Attending: Obstetrics | Admitting: Obstetrics

## 2016-03-27 DIAGNOSIS — T8332XD Displacement of intrauterine contraceptive device, subsequent encounter: Secondary | ICD-10-CM

## 2016-03-27 DIAGNOSIS — Z30431 Encounter for routine checking of intrauterine contraceptive device: Secondary | ICD-10-CM | POA: Diagnosis present

## 2016-03-27 DIAGNOSIS — N83202 Unspecified ovarian cyst, left side: Secondary | ICD-10-CM | POA: Insufficient documentation

## 2016-03-29 ENCOUNTER — Ambulatory Visit
Admission: RE | Admit: 2016-03-29 | Discharge: 2016-03-29 | Disposition: A | Discharge status: Home / Self Care | Payer: BC Managed Care – PPO | Source: Ambulatory Visit | Attending: Obstetrics | Admitting: Obstetrics

## 2016-03-29 ENCOUNTER — Encounter: Payer: Self-pay | Admitting: Radiology

## 2016-03-29 DIAGNOSIS — Z1239 Encounter for other screening for malignant neoplasm of breast: Secondary | ICD-10-CM

## 2016-04-18 ENCOUNTER — Encounter: Payer: Self-pay | Admitting: Obstetrics

## 2016-04-23 ENCOUNTER — Telehealth: Payer: Self-pay | Admitting: *Deleted

## 2016-04-23 ENCOUNTER — Other Ambulatory Visit: Payer: Self-pay | Admitting: Obstetrics

## 2016-04-23 DIAGNOSIS — N83202 Unspecified ovarian cyst, left side: Secondary | ICD-10-CM

## 2016-04-23 NOTE — Telephone Encounter (Signed)
Pt has been made aware of u/s results and recommendations for repeat in 3 months.

## 2016-04-23 NOTE — Telephone Encounter (Signed)
Attempt to call pt regarding u/s results. LM on VM to call office.

## 2016-04-23 NOTE — Telephone Encounter (Signed)
-----   Message from Brock Bad, MD sent at 04/23/2016  8:31 AM EDT ----- Regarding: FW: u/s results Ultrasound WNL's.  IUD in normal position.  Small left ovarian cyst.  Repeat U/S ordered in 3 months, per recommendation of Radiologist ----- Message ----- From: Lanney Gins, CMA Sent: 04/22/2016   2:14 PM To: Brock Bad, MD Subject: u/s results                                    Dr Clearance Coots,  Ms Pohlmann would like to know the results of her latest u/s. I could not yet give results as nothing resulted in our "in basket".  Please advise of u/s results so that we may make pt aware.  Thanks.

## 2016-04-23 NOTE — Telephone Encounter (Signed)
-----   Message from Charles A Harper, MD sent at 04/23/2016  8:31 AM EDT ----- Regarding: FW: u/s results Ultrasound WNL's.  IUD in normal position.  Small left ovarian cyst.  Repeat U/S ordered in 3 months, per recommendation of Radiologist ----- Message ----- From: Suzanne D Foster, CMA Sent: 04/22/2016   2:14 PM To: Charles A Harper, MD Subject: u/s results                                    Dr Harper,  Ms Edsall would like to know the results of her latest u/s. I could not yet give results as nothing resulted in our "in basket".  Please advise of u/s results so that we may make pt aware.  Thanks.  

## 2016-07-22 ENCOUNTER — Ambulatory Visit (HOSPITAL_COMMUNITY): Payer: BC Managed Care – PPO | Attending: Obstetrics

## 2017-02-10 ENCOUNTER — Ambulatory Visit (INDEPENDENT_AMBULATORY_CARE_PROVIDER_SITE_OTHER): Payer: BC Managed Care – PPO | Admitting: Obstetrics

## 2017-02-10 ENCOUNTER — Encounter: Payer: Self-pay | Admitting: Obstetrics

## 2017-02-10 VITALS — BP 125/80 | HR 78 | Ht 63.0 in | Wt 169.0 lb

## 2017-02-10 DIAGNOSIS — Z113 Encounter for screening for infections with a predominantly sexual mode of transmission: Secondary | ICD-10-CM

## 2017-02-10 DIAGNOSIS — Z30431 Encounter for routine checking of intrauterine contraceptive device: Secondary | ICD-10-CM

## 2017-02-10 DIAGNOSIS — Z1151 Encounter for screening for human papillomavirus (HPV): Secondary | ICD-10-CM | POA: Diagnosis not present

## 2017-02-10 DIAGNOSIS — Z124 Encounter for screening for malignant neoplasm of cervix: Secondary | ICD-10-CM

## 2017-02-10 DIAGNOSIS — N393 Stress incontinence (female) (male): Secondary | ICD-10-CM | POA: Diagnosis not present

## 2017-02-10 DIAGNOSIS — Z01411 Encounter for gynecological examination (general) (routine) with abnormal findings: Secondary | ICD-10-CM

## 2017-02-10 DIAGNOSIS — Z01419 Encounter for gynecological examination (general) (routine) without abnormal findings: Secondary | ICD-10-CM

## 2017-02-10 NOTE — Progress Notes (Signed)
Subjective:        [name] is a 47 y.o. female here for a routine exam.  Current complaints: Leaking urine with cough, sneeze, laughing, etc.   Personal health questionnaire:  Is patient Ashkenazi Jewish, have a family history of breast and/or ovarian cancer: no Is there a family history of uterine cancer diagnosed at age < 41, gastrointestinal cancer, urinary tract cancer, family member who is a Personnel officer syndrome-associated carrier: no Is the patient overweight and hypertensive, family history of diabetes, personal history of gestational diabetes, preeclampsia or PCOS: no Is patient over 38, have PCOS,  family history of premature CHD under age 22, diabetes, smoke, have hypertension or peripheral artery disease:  no At any time, has a partner hit, kicked or otherwise hurt or frightened you?: no Over the past 2 weeks, have you felt down, depressed or hopeless?: no Over the past 2 weeks, have you felt little interest or pleasure in doing things?:no   Gynecologic History No LMP recorded. Patient is not currently having periods (Reason: IUD). Contraception: IUD Last Pap: 2018. Results were: normal Last mammogram: 2018. Results were: normal  Obstetric History OB History  Gravida Para Term Preterm AB Living  2 2 2  0 0 2  SAB TAB Ectopic Multiple Live Births  0 0 0 0 2    # Outcome Date GA Lbr Len/2nd Weight Sex Delivery Anes PTL Lv  2 Term 03/07/07 [redacted]w[redacted]d   F CS-LTranv Spinal  LIV  1 Term 05/28/91 [redacted]w[redacted]d   M CS-Classical Spinal  LIV      History reviewed. No pertinent past medical history.  Past Surgical History:  Procedure Laterality Date  . CESAREAN SECTION       Current Outpatient Medications:  .  clotrimazole (LOTRIMIN) 1 % cream, Apply 1 application topically 2 (two) times daily., Disp: 113 g, Rfl: 4 .  clobetasol ointment (TEMOVATE) 0.05 %, Apply 1 application topically 2 (two) times daily. (Patient not taking: Reported on 03/19/2016), Disp: 30 g, Rfl: 0 .   fluconazole (DIFLUCAN) 200 MG tablet, Take 1 tablet (200 mg total) by mouth once. Repeat in 48-72 hours. (Patient not taking: Reported on 03/19/2016), Disp: 2 tablet, Rfl: 0 .  hydrocortisone cream 1 %, APPLY TO AFFECTED AREA TWICE A DAY (Patient not taking: Reported on 03/19/2016), Disp: 28 g, Rfl: 0 .  triamcinolone ointment (KENALOG) 0.5 %, APPLY TO AFFECTED AREA TWICE A DAY (Patient not taking: Reported on 03/19/2016), Disp: 30 g, Rfl: 0 Allergies  Allergen Reactions  . Latex Rash    Social History   Tobacco Use  . Smoking status: Former Smoker    Last attempt to quit: 05/2016    Years since quitting: 0.7  . Smokeless tobacco: Never Used  Substance Use Topics  . Alcohol use: Yes    Alcohol/week: 0.0 oz    Comment: socially    Family History  Problem Relation Age of Onset  . Diabetes Mother   . Hypertension Mother   . Fibroids Maternal Grandmother       Review of Systems  Constitutional: negative for fatigue and weight loss Respiratory: negative for cough and wheezing Cardiovascular: negative for chest pain, fatigue and palpitations Gastrointestinal: negative for abdominal pain and change in bowel habits Musculoskeletal:negative for myalgias Neurological: negative for gait problems and tremors Behavioral/Psych: negative for abusive relationship, depression Endocrine: negative for temperature intolerance    Genitourinary:negative for abnormal menstrual periods, genital lesions, hot flashes, sexual problems and vaginal discharge.  Positive for leaking  urine with cough, sneeze, etc. Integument/breast: negative for breast lump, breast tenderness, nipple discharge and skin lesion(s)    Objective:       BP 125/80   Pulse 78   Ht 5\' 3"  (1.6 m)   Wt 169 lb (76.7 kg)   BMI 29.94 kg/m  General:   alert  Skin:   no rash or abnormalities  Lungs:   clear to auscultation bilaterally  Heart:   regular rate and rhythm, S1, S2 normal, no murmur, click, rub or gallop  Breasts:    normal without suspicious masses, skin or nipple changes or axillary nodes  Abdomen:  normal findings: no organomegaly, soft, non-tender and no hernia  Pelvis:  External genitalia: normal general appearance Urinary system: urethral meatus normal and bladder without fullness, nontender Vaginal: normal without tenderness, induration or masses Cervix: normal appearance.  IUD string visible. Adnexa: normal bimanual exam Uterus: anteverted and non-tender, normal size   Lab Review Urine pregnancy test Labs reviewed yes Radiologic studies reviewed yes  50% of 20 min visit spent on counseling and coordination of care.   Assessment:    - 1. Encounter for routine gynecological examination with Papanicolaou smear of cervix Rx: - Cytology - PAP - Cervicovaginal ancillary only  2. SUI (stress urinary incontinence, female) - mild Rx: - Urine Culture - Kegel exercises recommended - UroGyn referral if leaking worsens to the point of adversely affecting daily activities  3. Encounter for routine checking of intrauterine contraceptive device (IUD) - pleased with Mirena IUD   Plan:    Education reviewed: calcium supplements, depression evaluation, low fat, low cholesterol diet, safe sex/STD prevention, self breast exams and weight bearing exercise. Contraception: IUD. Follow up in: 1 year.   No orders of the defined types were placed in this encounter.  Orders Placed This Encounter  Procedures  . Urine Culture

## 2017-02-10 NOTE — Progress Notes (Signed)
LMP:No cycles w/ IUD  Mammogram:03/29/2016 WNL   Last Pap:03/19/2016 Colonoscopy:N/A  Contraception:Mirena  PCP: [facility]   STD Screening: Declines

## 2017-02-11 LAB — CYTOLOGY - PAP
Diagnosis: NEGATIVE
HPV: NOT DETECTED

## 2017-02-11 LAB — CERVICOVAGINAL ANCILLARY ONLY
Bacterial vaginitis: NEGATIVE
Candida vaginitis: NEGATIVE
Chlamydia: NEGATIVE
Neisseria Gonorrhea: NEGATIVE
Trichomonas: NEGATIVE

## 2017-03-14 ENCOUNTER — Other Ambulatory Visit: Payer: Self-pay | Admitting: Obstetrics

## 2017-03-14 DIAGNOSIS — Z1231 Encounter for screening mammogram for malignant neoplasm of breast: Secondary | ICD-10-CM

## 2017-04-07 ENCOUNTER — Other Ambulatory Visit: Payer: Self-pay | Admitting: Obstetrics

## 2017-04-07 ENCOUNTER — Telehealth: Payer: Self-pay | Admitting: *Deleted

## 2017-04-07 DIAGNOSIS — Z1231 Encounter for screening mammogram for malignant neoplasm of breast: Secondary | ICD-10-CM

## 2017-04-07 NOTE — Telephone Encounter (Signed)
Pt called in to office stating she spoke with Mammo Scheduling and was advised to have her provider order a Mammo diagnostic test since she is having breast pain. Pt made aware message to be sent to provider so correct order can be placed.    Please order  Diagnostic Mammo in order for pt to schedule.

## 2017-04-08 ENCOUNTER — Other Ambulatory Visit: Payer: Self-pay | Admitting: Obstetrics

## 2017-04-08 DIAGNOSIS — N644 Mastodynia: Secondary | ICD-10-CM

## 2017-04-22 ENCOUNTER — Other Ambulatory Visit: Payer: Self-pay | Admitting: Obstetrics

## 2017-04-22 DIAGNOSIS — N644 Mastodynia: Secondary | ICD-10-CM

## 2017-04-30 ENCOUNTER — Ambulatory Visit
Admission: RE | Admit: 2017-04-30 | Discharge: 2017-04-30 | Disposition: A | Discharge status: Home / Self Care | Payer: BC Managed Care – PPO | Source: Ambulatory Visit | Attending: Obstetrics | Admitting: Obstetrics

## 2017-04-30 DIAGNOSIS — N644 Mastodynia: Secondary | ICD-10-CM

## 2017-08-19 ENCOUNTER — Other Ambulatory Visit: Payer: Self-pay | Admitting: Obstetrics

## 2017-08-19 DIAGNOSIS — B369 Superficial mycosis, unspecified: Secondary | ICD-10-CM

## 2017-08-19 NOTE — Telephone Encounter (Signed)
Please review for refill.  

## 2018-08-14 ENCOUNTER — Encounter: Payer: Self-pay | Admitting: Gastroenterology

## 2018-08-21 ENCOUNTER — Encounter: Payer: Self-pay | Admitting: Gastroenterology

## 2018-08-21 ENCOUNTER — Ambulatory Visit: Payer: BC Managed Care – PPO | Admitting: Gastroenterology

## 2018-08-21 VITALS — BP 116/70 | HR 76 | Temp 99.1°F | Ht 63.5 in | Wt 170.0 lb

## 2018-08-21 DIAGNOSIS — Z1211 Encounter for screening for malignant neoplasm of colon: Secondary | ICD-10-CM | POA: Diagnosis not present

## 2018-08-21 DIAGNOSIS — K5909 Other constipation: Secondary | ICD-10-CM | POA: Diagnosis not present

## 2018-08-21 NOTE — Progress Notes (Signed)
Reviewed and agree with management plan.  Malcolm T. Stark, MD FACG 

## 2018-08-21 NOTE — Patient Instructions (Addendum)
It has been recommended to you by your physician/provider that you have a(n) colonoscopy completed. Per your request, we did not schedule the procedure(s) today. Please contact our office at [phone] to schedule in September or October when it is more convenient for you.  Continue Linzess 145 mcg daily.  If you are age 47 or older, your body mass index should be between 23-30. Your Body mass index is 29.64 kg/m. If this is out of the aforementioned range listed, please consider follow up with your Primary Care Provider.  If you are age 86 or younger, your body mass index should be between 19-25. Your Body mass index is 29.64 kg/m. If this is out of the aformentioned range listed, please consider follow up with your Primary Care Provider.

## 2018-08-21 NOTE — Progress Notes (Signed)
08/21/2018 [name] [id number] 1970/04/14   HISTORY OF PRESENT ILLNESS: This is a 48 year old female who was referred to our office by her PCP, Dr. Osborne Casco, in order to discuss colonoscopy.  She reports that her mother is a patient of Dr. Lynne Leader so she would like him to be her physician as well.  She says her mother has history of colon polyps.  She is a maternal uncle who also had colon cancer.  She reports lifelong constipation.  Currently only using Linzess 145 mcg on an as-needed basis to help her produce a bowel movement when she feels like she has not had an adequate bowel movement in several days.  She says that she has been constipated as long as she can remember.  She denies any rectal bleeding.  No other issues.  She would like to proceed with colonoscopy, but would like to wait until later this fall.   Past Medical History:  Diagnosis Date  . Anxiety   . Eczema   . Fibromyalgia   . Heart murmur   . Ovarian cyst   . Urinary, incontinence, stress female    Past Surgical History:  Procedure Laterality Date  . CESAREAN SECTION      reports that she quit smoking about 2 years ago. Her smoking use included cigarettes. She started smoking about 19 years ago. She smoked 1.00 pack per day. She has never used smokeless tobacco. She reports current alcohol use. No history on file for drug. family history includes Alcoholism in her father and paternal grandmother; Aneurysm in her maternal grandmother; Asthma in her mother; Cancer in an other family member; Colon cancer in her maternal uncle; Colonic polyp in her mother; Diabetes in her mother and paternal grandmother; Fibroids in her maternal grandmother; Heart disease in her maternal grandmother and mother; Hyperlipidemia in her maternal grandmother and mother; Hypertension in her maternal grandmother and mother; Irritable bowel syndrome in her maternal aunt; Kidney disease in her maternal grandfather; Stroke in her maternal  grandmother. Allergies  Allergen Reactions  . Latex Rash      Outpatient Encounter Medications as of 08/21/2018  Medication Sig  . LINZESS 145 MCG CAPS capsule Take 145 mcg by mouth daily.  . Multiple Vitamin (MULTIVITAMIN) tablet Take 1 tablet by mouth daily.  . [DISCONTINUED] clobetasol ointment (TEMOVATE) 3.76 % Apply 1 application topically 2 (two) times daily. (Patient not taking: Reported on 03/19/2016)  . [DISCONTINUED] clotrimazole (LOTRIMIN) 1 % cream APPLY 1 APPLICATION TOPICALLY 2 (TWO) TIMES DAILY.  . [DISCONTINUED] fluconazole (DIFLUCAN) 200 MG tablet Take 1 tablet (200 mg total) by mouth once. Repeat in 48-72 hours. (Patient not taking: Reported on 03/19/2016)  . [DISCONTINUED] hydrocortisone cream 1 % APPLY TO AFFECTED AREA TWICE A DAY (Patient not taking: Reported on 03/19/2016)  . [DISCONTINUED] triamcinolone ointment (KENALOG) 0.5 % APPLY TO AFFECTED AREA TWICE A DAY (Patient not taking: Reported on 03/19/2016)   No facility-administered encounter medications on file as of 08/21/2018.      REVIEW OF SYSTEMS  : All other systems reviewed and negative except where noted in the History of Present Illness.   PHYSICAL EXAM: BP 116/70 (BP Location: Left Arm, Patient Position: Sitting, Cuff Size: Normal)   Pulse 76   Temp 99.1 F (37.3 C)   Ht 5' 3.5" (1.613 m) Comment: height measured without shoes  Wt 170 lb (77.1 kg)   BMI 29.64 kg/m  General: Well developed black female in no acute distress Head: Normocephalic and atraumatic  Eyes:  Sclerae anicteric, conjunctiva pink. Ears: Normal auditory acuity Lungs: Clear throughout to auscultation; no increased WOB. Heart: Regular rate and rhythm; no M/R/G. Abdomen: Soft, non-distended.  BS present.  Non-tender. Musculoskeletal: Symmetrical with no gross deformities  Skin: No lesions on visible extremities Extremities: No edema  Neurological: Alert oriented x 4, grossly non-focal Psychological:  Alert and cooperative. Normal mood  and affect  ASSESSMENT AND PLAN: *Screening for CRC: Family history of colon cancer in her maternal uncle and history of polyps in her mother.  We discussed performing colonoscopy and she would like to proceed before the end of the year, but wants to wait until later in the fall.  She will call back to schedule. *Chronic constipation: Does well with using Linzess 145 mcg only prn.  Gets multiple episodes of diarrhea after taking a dose of that.  Samples of 72 mcg were given for her to try that instead.   CC:  Tisovec, Adelfa Kohichard W, MD

## 2018-08-24 ENCOUNTER — Ambulatory Visit (INDEPENDENT_AMBULATORY_CARE_PROVIDER_SITE_OTHER): Payer: BC Managed Care – PPO | Admitting: Obstetrics & Gynecology

## 2018-08-24 ENCOUNTER — Encounter: Payer: Self-pay | Admitting: Obstetrics & Gynecology

## 2018-08-24 ENCOUNTER — Other Ambulatory Visit: Payer: Self-pay

## 2018-08-24 VITALS — BP 130/78 | HR 73 | Ht 62.5 in | Wt 170.0 lb

## 2018-08-24 DIAGNOSIS — Z1151 Encounter for screening for human papillomavirus (HPV): Secondary | ICD-10-CM

## 2018-08-24 DIAGNOSIS — Z124 Encounter for screening for malignant neoplasm of cervix: Secondary | ICD-10-CM

## 2018-08-24 DIAGNOSIS — Z01419 Encounter for gynecological examination (general) (routine) without abnormal findings: Secondary | ICD-10-CM

## 2018-08-24 NOTE — Progress Notes (Signed)
Subjective:    [name] is a 48 y.o. single P2 (27, 11kids  and raising her 3yo granddaughter)  female who presents for an annual exam. The patient has no complaints today. The patient is sexually active. GYN screening history: last pap: was normal. The patient wears seatbelts: yes. The patient participates in regular exercise: no. Has the patient ever been transfused or tattooed?: yes. The patient reports that there is not domestic violence in her life.   Menstrual History: OB History    Gravida  2   Para  2   Term  2   Preterm  0   AB  0   Living  2     SAB  0   TAB  0   Ectopic  0   Multiple  0   Live Births  2           Menarche age: 59 No LMP recorded. (Menstrual status: IUD).    The following portions of the patient's history were reviewed and updated as appropriate: allergies, current medications, past family history, past medical history, past social history, past surgical history and problem list.  Review of Systems Pertinent items are noted in HPI.   Has had Mirena in for about 5 years, no periods.   FH- + breast cancer in a distant cousin, + ovarian cancer maternal great GM Works for [employer]  Objective:    BP 130/78   Pulse 73   Ht 5' 2.5" (1.588 m)   Wt 170 lb (77.1 kg)   BMI 30.60 kg/m   General Appearance:    Alert, cooperative, no distress, appears stated age  Head:    Normocephalic, without obvious abnormality, atraumatic  Eyes:    PERRL, conjunctiva/corneas clear, EOM's intact, fundi    benign, both eyes  Ears:    Normal TM's and external ear canals, both ears  Nose:   Nares normal, septum midline, mucosa normal, no drainage    or sinus tenderness  Throat:   Lips, mucosa, and tongue normal; teeth and gums normal  Neck:   Supple, symmetrical, trachea midline, no adenopathy;    thyroid:  no enlargement/tenderness/nodules; no carotid   bruit or JVD  Back:     Symmetric, no curvature, ROM normal, no CVA tenderness   Lungs:     Clear to auscultation bilaterally, respirations unlabored  Chest Wall:    No tenderness or deformity   Heart:    Regular rate and rhythm, S1 and S2 normal, no murmur, rub   or gallop  Breast Exam:    No tenderness, masses, or nipple abnormality  Abdomen:     Soft, non-tender, bowel sounds active all four quadrants,    no masses, no organomegaly  Genitalia:    Normal female without lesion, discharge or tenderness, normal size and shape, anteverted, mobile, non-tender, normal adnexal exam      Extremities:   Extremities normal, atraumatic, no cyanosis or edema  Pulses:   2+ and symmetric all extremities  Skin:   Skin color, texture, turgor normal, no rashes or lesions  Lymph nodes:   Cervical, supraclavicular, and axillary nodes normal  Neurologic:   CNII-XII intact, normal strength, sensation and reflexes    throughout  .    Assessment:    Healthy female exam.    Plan:     Thin prep Pap smear. with cotesting

## 2018-08-26 LAB — CYTOLOGY - PAP
Diagnosis: NEGATIVE
HPV: NOT DETECTED

## 2018-08-27 ENCOUNTER — Ambulatory Visit
Admission: RE | Admit: 2018-08-27 | Discharge: 2018-08-27 | Disposition: A | Discharge status: Home / Self Care | Payer: BC Managed Care – PPO | Source: Ambulatory Visit | Attending: Obstetrics & Gynecology | Admitting: Obstetrics & Gynecology

## 2018-08-27 ENCOUNTER — Other Ambulatory Visit: Payer: Self-pay

## 2018-08-27 ENCOUNTER — Other Ambulatory Visit: Payer: Self-pay | Admitting: Obstetrics & Gynecology

## 2018-08-27 DIAGNOSIS — Z01419 Encounter for gynecological examination (general) (routine) without abnormal findings: Secondary | ICD-10-CM

## 2019-01-06 ENCOUNTER — Ambulatory Visit: Payer: BC Managed Care – PPO | Admitting: Obstetrics

## 2019-01-06 ENCOUNTER — Other Ambulatory Visit: Payer: Self-pay

## 2019-01-06 ENCOUNTER — Encounter: Payer: Self-pay | Admitting: Obstetrics

## 2019-01-06 VITALS — BP 136/76 | HR 69 | Ht 63.0 in | Wt 165.8 lb

## 2019-01-06 DIAGNOSIS — N898 Other specified noninflammatory disorders of vagina: Secondary | ICD-10-CM | POA: Diagnosis not present

## 2019-01-06 DIAGNOSIS — R8290 Unspecified abnormal findings in urine: Secondary | ICD-10-CM

## 2019-01-06 DIAGNOSIS — Z113 Encounter for screening for infections with a predominantly sexual mode of transmission: Secondary | ICD-10-CM

## 2019-01-06 LAB — POCT URINALYSIS DIPSTICK
Bilirubin, UA: NEGATIVE
Glucose, UA: NEGATIVE
Ketones, UA: NEGATIVE
Leukocytes, UA: NEGATIVE
Nitrite, UA: NEGATIVE
Protein, UA: NEGATIVE
Spec Grav, UA: 1.02 (ref 1.010–1.025)
Urobilinogen, UA: 0.2 E.U./dL
pH, UA: 6 (ref 5.0–8.0)

## 2019-01-06 MED ORDER — FLUCONAZOLE 150 MG PO TABS: 150.0000 mg | ORAL_TABLET | Freq: Once | ORAL | 0 refills | Status: AC

## 2019-01-06 MED ORDER — NITROFURANTOIN MONOHYD MACRO 100 MG PO CAPS: 100.0000 mg | ORAL_CAPSULE | Freq: Two times a day (BID) | ORAL | 2 refills | Status: DC

## 2019-01-06 NOTE — Progress Notes (Signed)
Patient ID: [name], female   DOB: 06/28/1970, 48 y.o.   MRN: [id number]  Chief Complaint  Patient presents with  . Gynecologic Exam    HPI [name] is a 48 y.o. female.  Complains of thick, white vaginal discharge and vaginal itching. HPI  Past Medical History:  Diagnosis Date  . Anxiety   . Eczema   . Fibromyalgia   . Heart murmur   . Ovarian cyst   . Urinary, incontinence, stress female     Past Surgical History:  Procedure Laterality Date  . CESAREAN SECTION      Family History  Problem Relation Age of Onset  . Diabetes Mother   . Hypertension Mother   . Asthma Mother   . Heart disease Mother   . Hyperlipidemia Mother   . Colonic polyp Mother   . Alcoholism Father   . Fibroids Maternal Grandmother   . Aneurysm Maternal Grandmother   . Heart disease Maternal Grandmother   . Hypertension Maternal Grandmother   . Hyperlipidemia Maternal Grandmother   . Stroke Maternal Grandmother   . Cancer Other        members with gyn ca  . Alcoholism Paternal Grandmother   . Diabetes Paternal Grandmother   . Colon cancer Maternal Uncle   . Kidney disease Maternal Grandfather   . Irritable bowel syndrome Maternal Aunt     Social History Social History   Tobacco Use  . Smoking status: Former Smoker    Packs/day: 1.00    Types: Cigarettes    Start date: 2001    Quit date: 05/2016    Years since quitting: 2.6  . Smokeless tobacco: Never Used  Substance Use Topics  . Alcohol use: Yes    Alcohol/week: 0.0 standard drinks    Comment: socially  . Drug use: Yes    Types: Marijuana    Comment: sometimes    Allergies  Allergen Reactions  . Latex Rash    Current Outpatient Medications  Medication Sig Dispense Refill  . Multiple Vitamin (MULTIVITAMIN) tablet Take 1 tablet by mouth daily.    . fluconazole (DIFLUCAN) 150 MG tablet Take 1 tablet (150 mg total) by mouth once for 1 dose. 1 tablet 0  . LINZESS 145 MCG CAPS capsule Take 145 mcg by mouth  daily.    . nitrofurantoin, macrocrystal-monohydrate, (MACROBID) 100 MG capsule Take 1 capsule (100 mg total) by mouth 2 (two) times daily. 1 po BID x 7days 14 capsule 2   No current facility-administered medications for this visit.    Review of Systems Review of Systems Constitutional: negative for fatigue and weight loss Respiratory: negative for cough and wheezing Cardiovascular: negative for chest pain, fatigue and palpitations Gastrointestinal: negative for abdominal pain and change in bowel habits Genitourinary:positive for vaginal discharge with itching Integument/breast: negative for nipple discharge Musculoskeletal:negative for myalgias Neurological: negative for gait problems and tremors Behavioral/Psych: negative for abusive relationship, depression Endocrine: negative for temperature intolerance      Blood pressure 136/76, pulse 69, height 5\' 3"  (1.6 m), weight 165 lb 12.8 oz (75.2 kg).  Physical Exam Physical Exam General:   alert  Skin:   no rash or abnormalities  Lungs:   clear to auscultation bilaterally  Heart:   regular rate and rhythm, S1, S2 normal, no murmur, click, rub or gallop  Breasts:   normal without suspicious masses, skin or nipple changes or axillary nodes  Abdomen:  normal findings: no organomegaly, soft, non-tender and no hernia  Pelvis:  External genitalia: normal general appearance Urinary system: urethral meatus normal and bladder without fullness, nontender Vaginal: normal without tenderness, induration or masses Cervix: normal appearance Adnexa: normal bimanual exam Uterus: anteverted and non-tender, normal size    50% of 15 min visit spent on counseling and coordination of care.   Data Reviewed Wet Prep Urinalysis  Assessment     1. Vaginal discharge Rx: - Cervicovaginal ancillary only( [place]) - fluconazole (DIFLUCAN) 150 MG tablet; Take 1 tablet (150 mg total) by mouth once for 1 dose.  Dispense: 1 tablet; Refill: 0  2.  Malodorous urine Rx: - POCT Urinalysis Dipstick - nitrofurantoin, macrocrystal-monohydrate, (MACROBID) 100 MG capsule; Take 1 capsule (100 mg total) by mouth 2 (two) times daily. 1 po BID x 7days  Dispense: 14 capsule; Refill: 2    Plan    Follow up in 8 months for Annual   Orders Placed This Encounter  Procedures  . POCT Urinalysis Dipstick   Meds ordered this encounter  Medications  . nitrofurantoin, macrocrystal-monohydrate, (MACROBID) 100 MG capsule    Sig: Take 1 capsule (100 mg total) by mouth 2 (two) times daily. 1 po BID x 7days    Dispense:  14 capsule    Refill:  2  . fluconazole (DIFLUCAN) 150 MG tablet    Sig: Take 1 tablet (150 mg total) by mouth once for 1 dose.    Dispense:  1 tablet    Refill:  0     Brock Bad, MD 01/06/2019 3:02 PM

## 2019-01-06 NOTE — Progress Notes (Signed)
Pt presents for experiencing thick, white vaginal discharge for about a month with no odor. She also complains of having pressure with urination as well malodorous urine.

## 2019-01-11 LAB — CERVICOVAGINAL ANCILLARY ONLY
Bacterial Vaginitis (gardnerella): NEGATIVE
Candida Glabrata: NEGATIVE
Candida Vaginitis: NEGATIVE
Chlamydia: NEGATIVE
Comment: NEGATIVE
Comment: NEGATIVE
Comment: NEGATIVE
Comment: NEGATIVE
Comment: NEGATIVE
Comment: NORMAL
Neisseria Gonorrhea: NEGATIVE
Trichomonas: NEGATIVE

## 2019-03-25 ENCOUNTER — Ambulatory Visit: Payer: BC Managed Care – PPO | Attending: Internal Medicine

## 2019-04-01 ENCOUNTER — Ambulatory Visit: Payer: BC Managed Care – PPO | Attending: Internal Medicine

## 2019-04-01 DIAGNOSIS — Z23 Encounter for immunization: Secondary | ICD-10-CM

## 2019-04-01 NOTE — Progress Notes (Signed)
   Covid-19 Vaccination Clinic  Name:  [name]    MRN: [id number] DOB: 09/15/1970  04/01/2019  [name] was observed post Covid-19 immunization for 15 minutes without incident. She was provided with Vaccine Information Sheet and instruction to access the V-Safe system.   [name] was instructed to call 911 with any severe reactions post vaccine: Marland Kitchen Difficulty breathing  . Swelling of face and throat  . A fast heartbeat  . A bad rash all over body  . Dizziness and weakness   Immunizations Administered    Name Date Dose VIS Date Route   Pfizer COVID-19 Vaccine 04/01/2019 10:08 AM 0.3 mL 12/25/2018 Intramuscular   Manufacturer: [employer], [employer]   Lot: [id number]   NDC: [address]-4

## 2019-04-26 ENCOUNTER — Ambulatory Visit: Payer: BC Managed Care – PPO | Attending: Internal Medicine

## 2019-04-26 DIAGNOSIS — Z23 Encounter for immunization: Secondary | ICD-10-CM

## 2019-04-26 NOTE — Progress Notes (Signed)
   Covid-19 Vaccination Clinic  Name:  [name]    MRN: [id number] DOB: 02/23/1970  04/26/2019  [name] was observed post Covid-19 immunization for 15 minutes without incident. She was provided with Vaccine Information Sheet and instruction to access the V-Safe system.   Ms. Poss was instructed to call 911 with any severe reactions post vaccine: Marland Kitchen Difficulty breathing  . Swelling of face and throat  . A fast heartbeat  . A bad rash all over body  . Dizziness and weakness   Immunizations Administered    Name Date Dose VIS Date Route   Pfizer COVID-19 Vaccine 04/26/2019  9:16 AM 0.3 mL 12/25/2018 Intramuscular   Manufacturer: [employer], [employer]   Lot: [id number]   NDC: [address]-2

## 2019-07-06 ENCOUNTER — Other Ambulatory Visit (HOSPITAL_COMMUNITY)
Admission: RE | Admit: 2019-07-06 | Discharge: 2019-07-06 | Disposition: A | Discharge status: Home / Self Care | Payer: BC Managed Care – PPO | Source: Ambulatory Visit | Attending: Obstetrics | Admitting: Obstetrics

## 2019-07-06 ENCOUNTER — Other Ambulatory Visit: Payer: Self-pay

## 2019-07-06 ENCOUNTER — Ambulatory Visit (INDEPENDENT_AMBULATORY_CARE_PROVIDER_SITE_OTHER): Payer: BC Managed Care – PPO | Admitting: Obstetrics

## 2019-07-06 ENCOUNTER — Encounter: Payer: Self-pay | Admitting: Obstetrics

## 2019-07-06 VITALS — BP 136/76 | HR 79 | Ht 62.5 in | Wt 158.6 lb

## 2019-07-06 DIAGNOSIS — Z3009 Encounter for other general counseling and advice on contraception: Secondary | ICD-10-CM

## 2019-07-06 DIAGNOSIS — L738 Other specified follicular disorders: Secondary | ICD-10-CM

## 2019-07-06 DIAGNOSIS — E569 Vitamin deficiency, unspecified: Secondary | ICD-10-CM

## 2019-07-06 DIAGNOSIS — Z30011 Encounter for initial prescription of contraceptive pills: Secondary | ICD-10-CM

## 2019-07-06 DIAGNOSIS — N898 Other specified noninflammatory disorders of vagina: Secondary | ICD-10-CM | POA: Diagnosis not present

## 2019-07-06 DIAGNOSIS — Z30432 Encounter for removal of intrauterine contraceptive device: Secondary | ICD-10-CM | POA: Diagnosis not present

## 2019-07-06 MED ORDER — LO LOESTRIN FE 1 MG-10 MCG / 10 MCG PO TABS: 1.0000 | ORAL_TABLET | Freq: Every day | ORAL | 11 refills | Status: DC

## 2019-07-06 MED ORDER — PRENATAL PLUS 27-1 MG PO TABS: 1.0000 | ORAL_TABLET | Freq: Every day | ORAL | 11 refills | Status: DC

## 2019-07-06 NOTE — Progress Notes (Signed)
Patient ID: [name], female   DOB: Apr 05, 1970, 49 y.o.   MRN: [id number]  Chief Complaint  Patient presents with  . GYN    HPI [name] is a 49 y.o. female.  Complains of hair bumps from shaving.  Wants IUD removed because of expiration.  HPI  Past Medical History:  Diagnosis Date  . Anxiety   . Eczema   . Fibromyalgia   . Heart murmur   . Ovarian cyst   . Urinary, incontinence, stress female     Past Surgical History:  Procedure Laterality Date  . CESAREAN SECTION      Family History  Problem Relation Age of Onset  . Diabetes Mother   . Hypertension Mother   . Asthma Mother   . Heart disease Mother   . Hyperlipidemia Mother   . Colonic polyp Mother   . Alcoholism Father   . Fibroids Maternal Grandmother   . Aneurysm Maternal Grandmother   . Heart disease Maternal Grandmother   . Hypertension Maternal Grandmother   . Hyperlipidemia Maternal Grandmother   . Stroke Maternal Grandmother   . Cancer Other        members with gyn ca  . Alcoholism Paternal Grandmother   . Diabetes Paternal Grandmother   . Colon cancer Maternal Uncle   . Kidney disease Maternal Grandfather   . Irritable bowel syndrome Maternal Aunt     Social History Social History   Tobacco Use  . Smoking status: Former Smoker    Packs/day: 1.00    Types: Cigarettes    Start date: 2001    Quit date: 05/2016    Years since quitting: 3.1  . Smokeless tobacco: Never Used  Vaping Use  . Vaping Use: Never used  Substance Use Topics  . Alcohol use: Yes    Alcohol/week: 0.0 standard drinks    Comment: socially  . Drug use: Yes    Types: Marijuana    Comment: sometimes    Allergies  Allergen Reactions  . Latex Rash    Current Outpatient Medications  Medication Sig Dispense Refill  . LINZESS 145 MCG CAPS capsule Take 145 mcg by mouth daily. (Patient not taking: Reported on 07/06/2019)    . Multiple Vitamin (MULTIVITAMIN) tablet Take 1 tablet by mouth daily. (Patient not  taking: Reported on 07/06/2019)    . nitrofurantoin, macrocrystal-monohydrate, (MACROBID) 100 MG capsule Take 1 capsule (100 mg total) by mouth 2 (two) times daily. 1 po BID x 7days (Patient not taking: Reported on 07/06/2019) 14 capsule 2  . prenatal vitamin w/FE, FA (PRENATAL 1 + 1) 27-1 MG TABS tablet Take 1 tablet by mouth daily before breakfast. 30 tablet 11   No current facility-administered medications for this visit.    Review of Systems Review of Systems Constitutional: negative for fatigue and weight loss Respiratory: negative for cough and wheezing Cardiovascular: negative for chest pain, fatigue and palpitations Gastrointestinal: negative for abdominal pain and change in bowel habits Genitourinary:positive for pubic folliculitis, healing Integument/breast: negative for nipple discharge Musculoskeletal:negative for myalgias Neurological: negative for gait problems and tremors Behavioral/Psych: negative for abusive relationship, depression Endocrine: negative for temperature intolerance      Blood pressure 136/76, pulse 79, height 5' 2.5" (1.588 m), weight 158 lb 9.6 oz (71.9 kg).  Physical Exam Physical Exam General:   alert and no distress  Skin:   no rash or abnormalities  Lungs:   clear to auscultation bilaterally  Heart:   regular rate and rhythm, S1, S2  normal, no murmur, click, rub or gallop  Breasts:   normal without suspicious masses, skin or nipple changes or axillary nodes  Abdomen:  normal findings: no organomegaly, soft, non-tender and no hernia  Pelvis:  External genitalia: folliculitis in mons pubis Urinary system: urethral meatus normal and bladder without fullness, nontender Vaginal: normal without tenderness, induration or masses Cervix: normal appearance.  IUD string visible Adnexa: normal bimanual exam Uterus: anteverted and non-tender, normal size    50% of 20 min visit spent on counseling and coordination of care.   Data Reviewed Wet Prep Last  pap smear  Assessment      1. Folliculitis barbae - recommend waxing or cream depilatories   2. Vaginal discharge Rx: - Cervicovaginal ancillary only( [place])  3. Vitamin deficiency Rx: - prenatal vitamin w/FE, FA (PRENATAL 1 + 1) 27-1 MG TABS tablet; Take 1 tablet by mouth daily before breakfast.  Dispense: 30 tablet; Refill: 11  4. Encounter for IUD removal  5. Encounter for other general counseling or advice on contraception - wants OCP's  6. Encounter for initial prescription of contraceptive pills Rx: - LO LOESTRIN FE 1 MG-10 MCG / 10 MCG tablet; Take 1 tablet by mouth daily.  Dispense: 28 tablet; Refill: 11    Plan   Follow up in 3 months for Annual / Pap  No orders of the defined types were placed in this encounter.  Meds ordered this encounter  Medications  . prenatal vitamin w/FE, FA (PRENATAL 1 + 1) 27-1 MG TABS tablet    Sig: Take 1 tablet by mouth daily before breakfast.    Dispense:  30 tablet    Refill:  11    Brock Bad, MD 07/06/2019 9:37 AM        GYNECOLOGY OFFICE PROCEDURE NOTE  [name] is a 49 y.o. [id number] here for Liletta IUD removal. No GYN concerns.  Last pap smear was on 08-24-2018 and was normal.  IUD Removal  Patient identified, informed consent performed, consent signed.  Patient was in the dorsal lithotomy position, normal external genitalia was noted.  A speculum was placed in the patient's vagina, normal discharge was noted, no lesions. The cervix was visualized, no lesions, no abnormal discharge.  The strings of the IUD were grasped and pulled using ring forceps. The IUD was removed in its entirety.  Patient tolerated the procedure well.    Patient will use OCP's ( Lo Loestrin ) for contraception.  Routine preventative health maintenance measures emphasized.   Brock Bad , MD, FACOG Obstetrician & Gynecologist, [facility] for [facility], [facility] 07/06/19

## 2019-07-06 NOTE — Progress Notes (Signed)
Pt is in the office, reports that she has a vaginal bump and is unsure if it is from shaving. Pt currently has IUD in place.

## 2019-07-07 LAB — CERVICOVAGINAL ANCILLARY ONLY
Bacterial Vaginitis (gardnerella): NEGATIVE
Candida Glabrata: NEGATIVE
Candida Vaginitis: NEGATIVE
Chlamydia: NEGATIVE
Comment: NEGATIVE
Comment: NEGATIVE
Comment: NEGATIVE
Comment: NEGATIVE
Comment: NEGATIVE
Comment: NORMAL
Neisseria Gonorrhea: NEGATIVE
Trichomonas: NEGATIVE

## 2019-09-27 ENCOUNTER — Other Ambulatory Visit: Payer: Self-pay | Admitting: Obstetrics

## 2019-09-27 DIAGNOSIS — Z1231 Encounter for screening mammogram for malignant neoplasm of breast: Secondary | ICD-10-CM

## 2019-09-29 ENCOUNTER — Other Ambulatory Visit (HOSPITAL_COMMUNITY)
Admission: RE | Admit: 2019-09-29 | Discharge: 2019-09-29 | Disposition: A | Discharge status: Home / Self Care | Payer: BC Managed Care – PPO | Source: Ambulatory Visit | Attending: Obstetrics | Admitting: Obstetrics

## 2019-09-29 ENCOUNTER — Encounter: Payer: Self-pay | Admitting: Obstetrics

## 2019-09-29 ENCOUNTER — Other Ambulatory Visit: Payer: Self-pay

## 2019-09-29 ENCOUNTER — Ambulatory Visit (INDEPENDENT_AMBULATORY_CARE_PROVIDER_SITE_OTHER): Payer: BC Managed Care – PPO | Admitting: Obstetrics

## 2019-09-29 VITALS — BP 130/81 | HR 75 | Ht 62.5 in | Wt 157.5 lb

## 2019-09-29 DIAGNOSIS — Z30015 Encounter for initial prescription of vaginal ring hormonal contraceptive: Secondary | ICD-10-CM

## 2019-09-29 DIAGNOSIS — Z01419 Encounter for gynecological examination (general) (routine) without abnormal findings: Secondary | ICD-10-CM | POA: Insufficient documentation

## 2019-09-29 DIAGNOSIS — N898 Other specified noninflammatory disorders of vagina: Secondary | ICD-10-CM

## 2019-09-29 DIAGNOSIS — F17200 Nicotine dependence, unspecified, uncomplicated: Secondary | ICD-10-CM

## 2019-09-29 DIAGNOSIS — Z3009 Encounter for other general counseling and advice on contraception: Secondary | ICD-10-CM

## 2019-09-29 MED ORDER — VARENICLINE TARTRATE 1 MG PO TABS: 1.0000 mg | ORAL_TABLET | Freq: Two times a day (BID) | ORAL | 1 refills | Status: DC

## 2019-09-29 MED ORDER — ETONOGESTREL-ETHINYL ESTRADIOL 0.12-0.015 MG/24HR VA RING: VAGINAL_RING | VAGINAL | 12 refills | Status: DC

## 2019-09-29 MED ORDER — CHANTIX STARTING MONTH PAK 0.5 MG X 11 & 1 MG X 42 PO TABS: ORAL_TABLET | ORAL | 0 refills | Status: DC

## 2019-09-29 NOTE — Progress Notes (Signed)
Subjective:        [name] is a 49 y.o. female here for a routine exam.  Current complaints: Vaginal dryness during intercourse.    Personal health questionnaire:  Is patient Ashkenazi Jewish, have a family history of breast and/or ovarian cancer: no Is there a family history of uterine cancer diagnosed at age < 47, gastrointestinal cancer, urinary tract cancer, family member who is a Personnel officer syndrome-associated carrier: no Is the patient overweight and hypertensive, family history of diabetes, personal history of gestational diabetes, preeclampsia or PCOS: no Is patient over 41, have PCOS,  family history of premature CHD under age 43, diabetes, smoke, have hypertension or peripheral artery disease:  no At any time, has a partner hit, kicked or otherwise hurt or frightened you?: no Over the past 2 weeks, have you felt down, depressed or hopeless?: no Over the past 2 weeks, have you felt little interest or pleasure in doing things?:no   Gynecologic History No LMP recorded. (Menstrual status: IUD). Contraception: none Last Pap: 08-24-2018. Results were: normal Last mammogram: 2020. Results were: normal  Obstetric History OB History  Gravida Para Term Preterm AB Living  2 2 2  0 0 2  SAB TAB Ectopic Multiple Live Births  0 0 0 0 2    # Outcome Date GA Lbr Len/2nd Weight Sex Delivery Anes PTL Lv  2 Term 03/07/07 [redacted]w[redacted]d   F CS-LTranv Spinal  LIV  1 Term 05/28/91 [redacted]w[redacted]d   M CS-Classical Spinal  LIV    Past Medical History:  Diagnosis Date  . Anxiety   . Eczema   . Fibromyalgia   . Heart murmur   . Ovarian cyst   . Urinary, incontinence, stress female     Past Surgical History:  Procedure Laterality Date  . CESAREAN SECTION       Current Outpatient Medications:  .  prenatal vitamin w/FE, FA (PRENATAL 1 + 1) 27-1 MG TABS tablet, Take 1 tablet by mouth daily before breakfast., Disp: 30 tablet, Rfl: 11 .  etonogestrel-ethinyl estradiol (NUVARING) 0.12-0.015 MG/24HR  vaginal ring, Insert vaginally and leave in place for 3 consecutive weeks, then remove for 1 week., Disp: 1 each, Rfl: 12 .  LINZESS 145 MCG CAPS capsule, Take 145 mcg by mouth daily. (Patient not taking: Reported on 07/06/2019), Disp: , Rfl:  .  Multiple Vitamin (MULTIVITAMIN) tablet, Take 1 tablet by mouth daily. (Patient not taking: Reported on 07/06/2019), Disp: , Rfl:  .  nitrofurantoin, macrocrystal-monohydrate, (MACROBID) 100 MG capsule, Take 1 capsule (100 mg total) by mouth 2 (two) times daily. 1 po BID x 7days (Patient not taking: Reported on 07/06/2019), Disp: 14 capsule, Rfl: 2 .  varenicline (CHANTIX CONTINUING MONTH PAK) 1 MG tablet, Take 1 tablet (1 mg total) by mouth 2 (two) times daily., Disp: 60 tablet, Rfl: 1 .  varenicline (CHANTIX STARTING MONTH PAK) 0.5 MG X 11 & 1 MG X 42 tablet, Take one 0.5 mg tablet by mouth once daily for 3 days, then increase to one 0.5 mg tablet twice daily for 4 days, then increase to one 1 mg tablet twice daily., Disp: 53 tablet, Rfl: 0 Allergies  Allergen Reactions  . Latex Rash    Social History   Tobacco Use  . Smoking status: Current Every Day Smoker    Packs/day: 0.50    Types: Cigarettes  . Smokeless tobacco: Never Used  Substance Use Topics  . Alcohol use: Yes    Alcohol/week: 0.0 standard drinks  Comment: socially    Family History  Problem Relation Age of Onset  . Diabetes Mother   . Hypertension Mother   . Asthma Mother   . Heart disease Mother   . Hyperlipidemia Mother   . Colonic polyp Mother   . Alcoholism Father   . Fibroids Maternal Grandmother   . Aneurysm Maternal Grandmother   . Heart disease Maternal Grandmother   . Hypertension Maternal Grandmother   . Hyperlipidemia Maternal Grandmother   . Stroke Maternal Grandmother   . Cancer Other        members with gyn ca  . Alcoholism Paternal Grandmother   . Diabetes Paternal Grandmother   . Colon cancer Maternal Uncle   . Kidney disease Maternal Grandfather   .  Irritable bowel syndrome Maternal Aunt       Review of Systems  Constitutional: negative for fatigue and weight loss Respiratory: negative for cough and wheezing Cardiovascular: negative for chest pain, fatigue and palpitations Gastrointestinal: negative for abdominal pain and change in bowel habits Musculoskeletal:negative for myalgias Neurological: negative for gait problems and tremors Behavioral/Psych: negative for abusive relationship, depression Endocrine: negative for temperature intolerance    Genitourinary:negative for abnormal menstrual periods, genital lesions, hot flashes, sexual problems and vaginal discharge Integument/breast: negative for breast lump, breast tenderness, nipple discharge and skin lesion(s)    Objective:       BP 130/81   Pulse 75   Ht 5' 2.5" (1.588 m)   Wt 157 lb 8 oz (71.4 kg)   BMI 28.35 kg/m  General:   alert and no distress  Skin:   no rash or abnormalities  Lungs:   clear to auscultation bilaterally  Heart:   regular rate and rhythm, S1, S2 normal, no murmur, click, rub or gallop  Breasts:   normal without suspicious masses, skin or nipple changes or axillary nodes  Abdomen:  normal findings: no organomegaly, soft, non-tender and no hernia  Pelvis:  External genitalia: normal general appearance Urinary system: urethral meatus normal and bladder without fullness, nontender Vaginal: normal without tenderness, induration or masses Cervix: normal appearance Adnexa: normal bimanual exam Uterus: anteverted and non-tender, normal size   Lab Review Urine pregnancy test Labs reviewed yes Radiologic studies reviewed no  50% of 20 min visit spent on counseling and coordination of care.   Assessment:     1. Encounter for routine gynecological examination with Papanicolaou smear of cervix Rx: - Cytology - PAP( [place])  2. Vaginal discharge Rx: - Cervicovaginal ancillary only( )  3. Encounter for other general counseling  and advice on contraception - wants NuvaRing  4. Encounter for initial prescription of vaginal ring hormonal contraceptive Rx: - etonogestrel-ethinyl estradiol (NUVARING) 0.12-0.015 MG/24HR vaginal ring; Insert vaginally and leave in place for 3 consecutive weeks, then remove for 1 week.  Dispense: 1 each; Refill: 12  5. Tobacco dependence Rx: - varenicline (CHANTIX STARTING MONTH PAK) 0.5 MG X 11 & 1 MG X 42 tablet; Take one 0.5 mg tablet by mouth once daily for 3 days, then increase to one 0.5 mg tablet twice daily for 4 days, then increase to one 1 mg tablet twice daily.  Dispense: 53 tablet; Refill: 0 - varenicline (CHANTIX CONTINUING MONTH PAK) 1 MG tablet; Take 1 tablet (1 mg total) by mouth 2 (two) times daily.  Dispense: 60 tablet; Refill: 1 - discussed risks with tobacco and E-P contraceptions, including clots, stroke, ect.  She plans to quit    Plan:  Education reviewed: calcium supplements, depression evaluation, low fat, low cholesterol diet, safe sex/STD prevention, self breast exams, smoking cessation and weight bearing exercise. Contraception: NuvaRing vaginal inserts. Follow up in: 1 year.   Meds ordered this encounter  Medications  . etonogestrel-ethinyl estradiol (NUVARING) 0.12-0.015 MG/24HR vaginal ring    Sig: Insert vaginally and leave in place for 3 consecutive weeks, then remove for 1 week.    Dispense:  1 each    Refill:  12  . varenicline (CHANTIX STARTING MONTH PAK) 0.5 MG X 11 & 1 MG X 42 tablet    Sig: Take one 0.5 mg tablet by mouth once daily for 3 days, then increase to one 0.5 mg tablet twice daily for 4 days, then increase to one 1 mg tablet twice daily.    Dispense:  53 tablet    Refill:  0  . varenicline (CHANTIX CONTINUING MONTH PAK) 1 MG tablet    Sig: Take 1 tablet (1 mg total) by mouth 2 (two) times daily.    Dispense:  60 tablet    Refill:  1   No orders of the defined types were placed in this encounter.   Brock Bad,  MD 09/29/2019 10:11 AM

## 2019-09-29 NOTE — Progress Notes (Signed)
Pt presents for annual, pap, and STD testing.  Pt c/o vaginal dryness during IC.  Pt requests to switch from BCP to IUD.  Mammogram scheduled 10/08/2019

## 2019-09-30 LAB — CERVICOVAGINAL ANCILLARY ONLY
Bacterial Vaginitis (gardnerella): NEGATIVE
Candida Glabrata: NEGATIVE
Candida Vaginitis: NEGATIVE
Chlamydia: NEGATIVE
Comment: NEGATIVE
Comment: NEGATIVE
Comment: NEGATIVE
Comment: NEGATIVE
Comment: NEGATIVE
Comment: NORMAL
Neisseria Gonorrhea: NEGATIVE
Trichomonas: NEGATIVE

## 2019-10-01 LAB — CYTOLOGY - PAP
Comment: NEGATIVE
Diagnosis: NEGATIVE
High risk HPV: NEGATIVE

## 2019-10-22 ENCOUNTER — Ambulatory Visit
Admission: RE | Admit: 2019-10-22 | Discharge: 2019-10-22 | Disposition: A | Discharge status: Home / Self Care | Payer: BC Managed Care – PPO | Source: Ambulatory Visit | Attending: Obstetrics | Admitting: Obstetrics

## 2019-10-22 ENCOUNTER — Other Ambulatory Visit: Payer: Self-pay

## 2019-10-22 DIAGNOSIS — Z1231 Encounter for screening mammogram for malignant neoplasm of breast: Secondary | ICD-10-CM

## 2019-11-19 DIAGNOSIS — N898 Other specified noninflammatory disorders of vagina: Secondary | ICD-10-CM

## 2019-11-22 MED ORDER — METRONIDAZOLE 500 MG PO TABS: 500.0000 mg | ORAL_TABLET | Freq: Two times a day (BID) | ORAL | 0 refills | Status: DC

## 2020-01-26 ENCOUNTER — Other Ambulatory Visit: Payer: Self-pay

## 2020-01-26 DIAGNOSIS — Z30015 Encounter for initial prescription of vaginal ring hormonal contraceptive: Secondary | ICD-10-CM

## 2020-01-26 MED ORDER — ETONOGESTREL-ETHINYL ESTRADIOL 0.12-0.015 MG/24HR VA RING: VAGINAL_RING | VAGINAL | 12 refills | Status: DC

## 2020-05-02 ENCOUNTER — Emergency Department (HOSPITAL_COMMUNITY): Payer: No Typology Code available for payment source

## 2020-05-02 ENCOUNTER — Observation Stay (HOSPITAL_COMMUNITY): Payer: No Typology Code available for payment source

## 2020-05-02 ENCOUNTER — Encounter (HOSPITAL_COMMUNITY): Payer: Self-pay

## 2020-05-02 ENCOUNTER — Other Ambulatory Visit: Payer: Self-pay

## 2020-05-02 ENCOUNTER — Inpatient Hospital Stay (HOSPITAL_COMMUNITY)
Admission: EM | Admit: 2020-05-02 | Discharge: 2020-05-04 | DRG: 176 | Disposition: A | Discharge status: Home / Self Care | Payer: No Typology Code available for payment source | Attending: Internal Medicine | Admitting: Internal Medicine

## 2020-05-02 DIAGNOSIS — R160 Hepatomegaly, not elsewhere classified: Secondary | ICD-10-CM | POA: Diagnosis not present

## 2020-05-02 DIAGNOSIS — Z825 Family history of asthma and other chronic lower respiratory diseases: Secondary | ICD-10-CM

## 2020-05-02 DIAGNOSIS — R634 Abnormal weight loss: Secondary | ICD-10-CM | POA: Diagnosis present

## 2020-05-02 DIAGNOSIS — Z833 Family history of diabetes mellitus: Secondary | ICD-10-CM

## 2020-05-02 DIAGNOSIS — I2699 Other pulmonary embolism without acute cor pulmonale: Secondary | ICD-10-CM | POA: Diagnosis not present

## 2020-05-02 DIAGNOSIS — Z800 Family history of malignant neoplasm of digestive organs: Secondary | ICD-10-CM

## 2020-05-02 DIAGNOSIS — R1011 Right upper quadrant pain: Secondary | ICD-10-CM

## 2020-05-02 DIAGNOSIS — Z20822 Contact with and (suspected) exposure to covid-19: Secondary | ICD-10-CM | POA: Diagnosis present

## 2020-05-02 DIAGNOSIS — Z8042 Family history of malignant neoplasm of prostate: Secondary | ICD-10-CM

## 2020-05-02 DIAGNOSIS — Z8261 Family history of arthritis: Secondary | ICD-10-CM

## 2020-05-02 DIAGNOSIS — Z803 Family history of malignant neoplasm of breast: Secondary | ICD-10-CM

## 2020-05-02 DIAGNOSIS — R911 Solitary pulmonary nodule: Secondary | ICD-10-CM | POA: Diagnosis present

## 2020-05-02 DIAGNOSIS — Z823 Family history of stroke: Secondary | ICD-10-CM

## 2020-05-02 DIAGNOSIS — Z91040 Latex allergy status: Secondary | ICD-10-CM

## 2020-05-02 DIAGNOSIS — M797 Fibromyalgia: Secondary | ICD-10-CM | POA: Diagnosis present

## 2020-05-02 DIAGNOSIS — Z8371 Family history of colonic polyps: Secondary | ICD-10-CM

## 2020-05-02 DIAGNOSIS — R590 Localized enlarged lymph nodes: Secondary | ICD-10-CM

## 2020-05-02 DIAGNOSIS — Z8349 Family history of other endocrine, nutritional and metabolic diseases: Secondary | ICD-10-CM

## 2020-05-02 DIAGNOSIS — F419 Anxiety disorder, unspecified: Secondary | ICD-10-CM | POA: Diagnosis present

## 2020-05-02 DIAGNOSIS — Z8249 Family history of ischemic heart disease and other diseases of the circulatory system: Secondary | ICD-10-CM

## 2020-05-02 DIAGNOSIS — F17210 Nicotine dependence, cigarettes, uncomplicated: Secondary | ICD-10-CM | POA: Diagnosis present

## 2020-05-02 DIAGNOSIS — Z6827 Body mass index (BMI) 27.0-27.9, adult: Secondary | ICD-10-CM

## 2020-05-02 DIAGNOSIS — Z79890 Hormone replacement therapy (postmenopausal): Secondary | ICD-10-CM

## 2020-05-02 DIAGNOSIS — Z79899 Other long term (current) drug therapy: Secondary | ICD-10-CM

## 2020-05-02 DIAGNOSIS — R61 Generalized hyperhidrosis: Secondary | ICD-10-CM | POA: Diagnosis present

## 2020-05-02 LAB — URINALYSIS, ROUTINE W REFLEX MICROSCOPIC
Bilirubin Urine: NEGATIVE
Glucose, UA: NEGATIVE mg/dL
Ketones, ur: 20 mg/dL — AB
Leukocytes,Ua: NEGATIVE
Nitrite: NEGATIVE
Protein, ur: NEGATIVE mg/dL
Specific Gravity, Urine: 1.019 (ref 1.005–1.030)
pH: 6 (ref 5.0–8.0)

## 2020-05-02 LAB — HEPARIN LEVEL (UNFRACTIONATED)
Heparin Unfractionated: 0.49 IU/mL (ref 0.30–0.70)
Heparin Unfractionated: 0.27 IU/mL — ABNORMAL LOW (ref 0.30–0.70)

## 2020-05-02 LAB — COMPREHENSIVE METABOLIC PANEL
ALT: 16 U/L (ref 0–44)
AST: 17 U/L (ref 15–41)
Albumin: 3.4 g/dL — ABNORMAL LOW (ref 3.5–5.0)
Alkaline Phosphatase: 108 U/L (ref 38–126)
Anion gap: 8 (ref 5–15)
BUN: 10 mg/dL (ref 6–20)
CO2: 21 mmol/L — ABNORMAL LOW (ref 22–32)
Calcium: 9.1 mg/dL (ref 8.9–10.3)
Chloride: 104 mmol/L (ref 98–111)
Creatinine, Ser: 0.92 mg/dL (ref 0.44–1.00)
GFR, Estimated: >60 mL/min (ref >60)
Glucose, Bld: 121 mg/dL — ABNORMAL HIGH (ref 70–99)
Potassium: 3.6 mmol/L (ref 3.5–5.1)
Sodium: 133 mmol/L — ABNORMAL LOW (ref 135–145)
Total Bilirubin: 0.6 mg/dL (ref 0.3–1.2)
Total Protein: 7.2 g/dL (ref 6.5–8.1)

## 2020-05-02 LAB — CBC
HCT: 35.8 % — ABNORMAL LOW (ref 36.0–46.0)
Hemoglobin: 11.9 g/dL — ABNORMAL LOW (ref 12.0–15.0)
MCH: 27.7 pg (ref 26.0–34.0)
MCHC: 33.2 g/dL (ref 30.0–36.0)
MCV: 83.4 fL (ref 80.0–100.0)
Platelets: 351 10*3/uL (ref 150–400)
RBC: 4.29 MIL/uL (ref 3.87–5.11)
RDW: 13.3 % (ref 11.5–15.5)
WBC: 13.3 10*3/uL — ABNORMAL HIGH (ref 4.0–10.5)
nRBC: 0 % (ref 0.0–0.2)

## 2020-05-02 LAB — I-STAT BETA HCG BLOOD, ED (MC, WL, AP ONLY): I-stat hCG, quantitative: <5 m[IU]/mL (ref <5)

## 2020-05-02 LAB — BRAIN NATRIURETIC PEPTIDE: B Natriuretic Peptide: 12 pg/mL (ref 0.0–100.0)

## 2020-05-02 LAB — TROPONIN I (HIGH SENSITIVITY): Troponin I (High Sensitivity): 27 ng/L — ABNORMAL HIGH (ref <18)

## 2020-05-02 LAB — HIV ANTIBODY (ROUTINE TESTING W REFLEX): HIV Screen 4th Generation wRfx: NONREACTIVE

## 2020-05-02 LAB — SARS CORONAVIRUS 2 (TAT 6-24 HRS): SARS Coronavirus 2: NEGATIVE

## 2020-05-02 LAB — LIPASE, BLOOD: Lipase: 37 U/L (ref 11–51)

## 2020-05-02 MED ORDER — DIPHENHYDRAMINE HCL 12.5 MG/5ML PO ELIX: 12.5000 mg | ORAL_SOLUTION | Freq: Four times a day (QID) | ORAL | Status: DC | PRN

## 2020-05-02 MED ORDER — HYDRALAZINE HCL 20 MG/ML IJ SOLN: 5.0000 mg | INTRAMUSCULAR | Status: DC | PRN

## 2020-05-02 MED ORDER — GADOBUTROL 1 MMOL/ML IV SOLN
7.0000 mL | Freq: Once | INTRAVENOUS | Status: AC | PRN
  Administered 2020-05-02: 7 mL via INTRAVENOUS

## 2020-05-02 MED ORDER — HEPARIN BOLUS VIA INFUSION
5000.0000 [IU] | Freq: Once | INTRAVENOUS | Status: AC
  Administered 2020-05-02: 5000 [IU] via INTRAVENOUS
  Filled 2020-05-02: qty 5000

## 2020-05-02 MED ORDER — NALOXONE HCL 0.4 MG/ML IJ SOLN: 0.4000 mg | INTRAMUSCULAR | Status: DC | PRN

## 2020-05-02 MED ORDER — IOHEXOL 350 MG/ML SOLN
80.0000 mL | Freq: Once | INTRAVENOUS | Status: AC | PRN
  Administered 2020-05-02: 75 mL via INTRAVENOUS

## 2020-05-02 MED ORDER — BISACODYL 5 MG PO TBEC: 5.0000 mg | DELAYED_RELEASE_TABLET | Freq: Every day | ORAL | Status: DC | PRN

## 2020-05-02 MED ORDER — LORAZEPAM 2 MG/ML IJ SOLN
1.0000 mg | Freq: Once | INTRAMUSCULAR | Status: AC
  Administered 2020-05-02: 1 mg via INTRAVENOUS
  Filled 2020-05-02: qty 1

## 2020-05-02 MED ORDER — SODIUM CHLORIDE 0.9% FLUSH: 9.0000 mL | INTRAVENOUS | Status: DC | PRN

## 2020-05-02 MED ORDER — ACETAMINOPHEN 325 MG PO TABS
650.0000 mg | ORAL_TABLET | Freq: Four times a day (QID) | ORAL | Status: DC | PRN
  Administered 2020-05-02 – 2020-05-03 (×3): 650 mg via ORAL
  Filled 2020-05-02 (×3): qty 2

## 2020-05-02 MED ORDER — HEPARIN (PORCINE) 25000 UT/250ML-% IV SOLN
1400.0000 [IU]/h | INTRAVENOUS | Status: DC
  Administered 2020-05-02: 1200 [IU]/h via INTRAVENOUS
  Administered 2020-05-03: 1250 [IU]/h via INTRAVENOUS
  Filled 2020-05-02 (×2): qty 250

## 2020-05-02 MED ORDER — MORPHINE SULFATE 1 MG/ML IV SOLN PCA
INTRAVENOUS | Status: DC
  Filled 2020-05-02 (×2): qty 30

## 2020-05-02 MED ORDER — SODIUM CHLORIDE 0.9% FLUSH
3.0000 mL | Freq: Two times a day (BID) | INTRAVENOUS | Status: DC
  Administered 2020-05-02 – 2020-05-04 (×4): 3 mL via INTRAVENOUS

## 2020-05-02 MED ORDER — ONDANSETRON HCL 4 MG/2ML IJ SOLN: 4.0000 mg | Freq: Four times a day (QID) | INTRAMUSCULAR | Status: DC | PRN

## 2020-05-02 MED ORDER — ONDANSETRON HCL 4 MG/2ML IJ SOLN
4.0000 mg | Freq: Once | INTRAMUSCULAR | Status: AC
  Administered 2020-05-02: 4 mg via INTRAVENOUS
  Filled 2020-05-02: qty 2

## 2020-05-02 MED ORDER — LACTATED RINGERS IV SOLN: INTRAVENOUS | Status: DC

## 2020-05-02 MED ORDER — MORPHINE SULFATE (PF) 4 MG/ML IV SOLN
4.0000 mg | Freq: Once | INTRAVENOUS | Status: AC
  Administered 2020-05-02: 4 mg via INTRAVENOUS
  Filled 2020-05-02: qty 1

## 2020-05-02 MED ORDER — MORPHINE SULFATE (PF) 2 MG/ML IV SOLN
2.0000 mg | INTRAVENOUS | Status: DC | PRN
  Administered 2020-05-02 – 2020-05-03 (×10): 2 mg via INTRAVENOUS
  Filled 2020-05-02 (×10): qty 1

## 2020-05-02 MED ORDER — POLYETHYLENE GLYCOL 3350 17 G PO PACK: 17.0000 g | PACK | Freq: Every day | ORAL | Status: DC | PRN

## 2020-05-02 MED ORDER — LACTATED RINGERS IV BOLUS
1000.0000 mL | Freq: Once | INTRAVENOUS | Status: AC
  Administered 2020-05-02: 1000 mL via INTRAVENOUS

## 2020-05-02 MED ORDER — DIPHENHYDRAMINE HCL 50 MG/ML IJ SOLN: 12.5000 mg | Freq: Four times a day (QID) | INTRAMUSCULAR | Status: DC | PRN

## 2020-05-02 MED ORDER — DOCUSATE SODIUM 100 MG PO CAPS
100.0000 mg | ORAL_CAPSULE | Freq: Two times a day (BID) | ORAL | Status: DC
  Administered 2020-05-03 – 2020-05-04 (×2): 100 mg via ORAL
  Filled 2020-05-02 (×3): qty 1

## 2020-05-02 MED ORDER — FENTANYL CITRATE (PF) 100 MCG/2ML IJ SOLN
50.0000 ug | Freq: Once | INTRAMUSCULAR | Status: AC
  Administered 2020-05-02: 50 ug via INTRAVENOUS
  Filled 2020-05-02: qty 2

## 2020-05-02 MED ORDER — ACETAMINOPHEN 650 MG RE SUPP: 650.0000 mg | Freq: Four times a day (QID) | RECTAL | Status: DC | PRN

## 2020-05-02 NOTE — ED Provider Notes (Signed)
Signout note  50 year old lady presenting with right flank, right upper quadrant abdominal pain.  Ultrasound negative.  CT renal stone concerning for groundglass opacity at right lower lobe concerning for pulmonary infarct/ischemia.  CTA chest ordered to further evaluate.  7:00 AM Received signout from Dr. Myrtis Ser  7:48 AM  Discussed case with Dr. Grace Isaac with radiology, findings concerning for bilateral acute PE with right lower lobe pulmonary ischemia.  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  He is recommending outpatient surveillance follow-up with either MRI or CT.  Discussed all of the above findings including the incidental finding of the mass and lymph nodes with patient.  Will start on heparin and admit to the hospitalist service for further management. Patient is currently well appearing, vitals stable, 100% on RA, no increased respiratory effort.  Discussed case with Ophelia Charter.   CRITICAL CARE Performed by: Milagros Loll   Total critical care time: 34 minutes  Critical care time was exclusive of separately billable procedures and treating other patients.  Critical care was necessary to treat or prevent imminent or life-threatening deterioration.  Critical care was time spent personally by me on the following activities: development of treatment plan with patient and/or surrogate as well as nursing, discussions with consultants, evaluation of patient's response to treatment, examination of patient, obtaining history from patient or surrogate, ordering and performing treatments and interventions, ordering and review of laboratory studies, ordering and review of radiographic studies, pulse oximetry and re-evaluation of patient's condition.    Milagros Loll, MD 05/02/20 [phone]

## 2020-05-02 NOTE — H&P (Addendum)
History and Physical    [name] ZOX:[id number] DOB: Jun 12, 1970 DOA: 05/02/2020  PCP: Gaspar Garbe, MD Consultants:  Clearance Coots - OB/GYN Patient coming from:  Home - lives with mother and 2 daughters; NOK: Mother, Lynden Oxford [phone]  Chief Complaint: Abdominal pain  HPI: [name] is a 50 y.o. female with medical history significant of fibromyalgia and anxiety presenting with abdominal pain.  Her menses ended on 4/11; during her menses she was having pain in her R side.  Yesterday, she woke up and her back hurt.  She took a Tylenol and she went to worse but the pain worsened.  Last night, she took a bath and laid down but it got even worse.  She came in for evaluation.  +SOB.  +cough, nonproductive.  Pain is in her R flank.  No urinary symptoms.  She does not usually have issues with periods.  +unintentional weight loss, 20 pounds in 9 months.  +night sweats, maybe 3-4 months.  No abdominal pain.  Normal BMs.    ED Course:  Acute PE with small pulmonary infarct.  Came in for R flank pain, no c/o SOB.  Started on Heparin.  ?sarcoidosis - LAD on CT.  ?mass.  Review of Systems: As per HPI; otherwise review of systems reviewed and negative.   Ambulatory Status:  Ambulates without assistance  COVID Vaccine Status:   Complete  Past Medical History:  Diagnosis Date  . Anxiety   . Eczema   . Fibromyalgia   . Heart murmur   . Ovarian cyst   . Urinary, incontinence, stress female     Past Surgical History:  Procedure Laterality Date  . CESAREAN SECTION      Social History   Socioeconomic History  . Marital status: Single    Spouse name: Not on file  . Number of children: 2  . Years of education: Not on file  . Highest education level: Not on file  Occupational History  . Occupation: [employer]   Tobacco Use  . Smoking status: Current Every Day Smoker    Packs/day: 1.00    Years: 14.00    Pack years: 14.00    Types: Cigarettes    Start date: 2003  . Smokeless  tobacco: Never Used  Vaping Use  . Vaping Use: Never used  Substance and Sexual Activity  . Alcohol use: Yes    Alcohol/week: 0.0 standard drinks    Comment: socially  . Drug use: Yes    Types: Marijuana    Comment: sometimes  . Sexual activity: Yes    Partners: Male    Birth control/protection: Pill  Other Topics Concern  . Not on file  Social History Narrative   3 children, 2 biological and 1 adopted   Social Determinants of Health   Financial Resource Strain: Not on file  Food Insecurity: Not on file  Transportation Needs: Not on file  Physical Activity: Not on file  Stress: Not on file  Social Connections: Not on file  Intimate Partner Violence: Not on file    Allergies  Allergen Reactions  . Latex Rash    Family History  Problem Relation Age of Onset  . Diabetes Mother   . Hypertension Mother   . Asthma Mother   . Heart disease Mother   . Hyperlipidemia Mother   . Colonic polyp Mother   . Rheum arthritis Mother   . Alcoholism Father   . Fibroids Maternal Grandmother   . Aneurysm Maternal Grandmother   .  Heart disease Maternal Grandmother   . Hypertension Maternal Grandmother   . Hyperlipidemia Maternal Grandmother   . Stroke Maternal Grandmother   . Cancer Other        members with gyn ca; pancreatic  . Alcoholism Paternal Grandmother   . Diabetes Paternal Grandmother   . Colon cancer Maternal Uncle   . Kidney disease Maternal Grandfather   . Irritable bowel syndrome Maternal Aunt     Prior to Admission medications   Medication Sig Start Date End Date Taking? Authorizing Provider  acetaminophen (TYLENOL) 500 MG tablet Take 1,000 mg by mouth every 6 (six) hours as needed for moderate pain or headache.   Yes [provider]  etonogestrel-ethinyl estradiol (NUVARING) 0.12-0.015 MG/24HR vaginal ring Insert vaginally and leave in place for 3 consecutive weeks, then remove for 1 week. 01/26/20  Yes Brock Bad, MD  LINZESS 145 MCG CAPS  capsule Take 145 mcg by mouth daily as needed (constipation). 06/15/18  Yes [provider]  prenatal vitamin w/FE, FA (PRENATAL 1 + 1) 27-1 MG TABS tablet Take 1 tablet by mouth daily before breakfast. 07/06/19  Yes Brock Bad, MD  varenicline (CHANTIX CONTINUING MONTH PAK) 1 MG tablet Take 1 tablet (1 mg total) by mouth 2 (two) times daily. 09/29/19  Yes Brock Bad, MD  varenicline (CHANTIX STARTING MONTH PAK) 0.5 MG X 11 & 1 MG X 42 tablet Take one 0.5 mg tablet by mouth once daily for 3 days, then increase to one 0.5 mg tablet twice daily for 4 days, then increase to one 1 mg tablet twice daily. 09/29/19  Yes Brock Bad, MD  metroNIDAZOLE (FLAGYL) 500 MG tablet Take 1 tablet (500 mg total) by mouth 2 (two) times daily. Patient not taking: No sig reported 11/22/19   Brock Bad, MD    Physical Exam: Vitals:   05/02/20 1100 05/02/20 1111 05/02/20 1240 05/02/20 1301  BP: 128/78 131/73  (!) 141/74  Pulse:  (!) 107  100  Resp: (!) 35 (!) 22  15  Temp:   100 F (37.8 C) 99.6 F (37.6 C)  TempSrc:   Oral   SpO2:  99%  100%  Weight:      Height:         . General:  Appears very uncomfortable - continuously moving around in the bed and standing up to try to find a comfortable position . Eyes:  PERRL, EOMI, normal lids, iris . ENT:  grossly normal hearing, lips & tongue, mmm; appropriate dentition . Neck:  no LAD, masses or thyromegaly . Cardiovascular:  RR with mild tachycardia, no m/r/g. No LE edema.  Marland Kitchen Respiratory:   CTA bilaterally with no wheezes/rales/rhonchi.  Mildly increased respiratory effort. . Abdomen:  soft, NT, ND . Back:   normal alignment, marked R-sided flank pain along midback . Skin:  no rash or induration seen on limited exam . Musculoskeletal:  grossly normal tone BUE/BLE, good ROM, no bony abnormality . Psychiatric:  flat mood and affect, speech fluent and appropriate, AOx3 . Neurologic:  CN 2-12 grossly intact, moves all extremities in  coordinated fashion    Radiological Exams on Admission: Independently reviewed - see discussion in A/P where applicable  CT Angio Chest PE W and/or Wo Contrast  Result Date: 05/02/2020 CLINICAL DATA:  Follow-up abnormal abdominal CT EXAM: CT ANGIOGRAPHY CHEST WITH CONTRAST TECHNIQUE: Multidetector CT imaging of the chest was performed using the standard protocol during bolus administration of intravenous contrast. Multiplanar CT  image reconstructions and MIPs were obtained to evaluate the vascular anatomy. CONTRAST:  75mL OMNIPAQUE IOHEXOL 350 MG/ML SOLN COMPARISON:  None of the chest FINDINGS: Cardiovascular: Acute appearing pulmonary emboli on both sides, more extensive on the right where there is lobar and segmental involvement in the lower lobe with occlusive clot causing the previously seen pulmonary ischemia. Basilar segmental clot is seen at the left lower lobe. No right heart strain by RV to LV ratio. No cardiomegaly or pericardial effusion. Normal appearance of the aorta Mediastinum/Nodes: Bilateral hilar and mediastinal adenopathy. Subcarinal nodal tissue is estimated at 16 mm. Bronchial lymph nodes appear prominent diffusely. A delayed phase was obtained through the diaphragm to evaluate the right costophrenic sulcus mass which measures 3.1 cm. This may still reflect a herniation of liver given broad contact with the liver, the shape, and isointensity on this phase. Given the mediastinal adenopathy mass or node of thoracic origin is not excluded. Lungs/Pleura: Ground-glass opacity in the right lower lobe which is attributed to ischemia. No pulmonary edema, effusion, or air leak Upper Abdomen: As described on preceding noncontrast CT. Musculoskeletal: No acute finding Review of the MIP images confirms the above findings. Critical Value/emergent results were called by telephone at the time of interpretation on 05/02/2020 at 7:20 am to provider Dr Stevie Kernykstra , who verbally acknowledged these results.  IMPRESSION: 1. Bilateral acute pulmonary emboli with segmental and lobar clot at the right lower lobe causing pulmonary ischemia. 2. Mediastinal and hilar lymphadenopathy, need outpatient follow-up to evaluate for sarcoid or lymphoproliferative disease. 3. 3.1 cm mass at the right cardiophrenic sulcus. A delayed phase was obtained and focal liver herniation remains the leading consideration but additional workup is recommended given #2. Ultimately, an enhanced MRI or CT surveillance may be needed. Electronically Signed   By: Marnee SpringJonathon  Watts M.D.   On: 05/02/2020 07:25   CT Renal Stone Study  Result Date: 05/02/2020 CLINICAL DATA:  Kidney stone with right flank pain EXAM: CT ABDOMEN AND PELVIS WITHOUT CONTRAST TECHNIQUE: Multidetector CT imaging of the abdomen and pelvis was performed following the standard protocol without IV contrast. COMPARISON:  Abdomen and pelvis CT with contrast 02/03/2014 FINDINGS: Lower chest: Segmental ground-glass opacity in the right lower lobe. This could be pulmonary ischemia, infection, or mild atelectasis. Mild streaky bilateral lower lobe atelectasis. Soft tissue density nodule at the right base which is likely above the diaphragm measures 2.8 cm. Hepatobiliary: No focal liver abnormality, assuming the above described nodule is extrahepatic.No evidence of biliary obstruction or stone. Pancreas: Unremarkable. Spleen: Unremarkable. Adrenals/Urinary Tract: Negative adrenals. No hydronephrosis or stone. Unremarkable bladder. Stomach/Bowel:  No obstruction. No appendicitis. Vascular/Lymphatic: No acute vascular abnormality. No mass or adenopathy. Reproductive:Vaginal medication ring in place Other: No ascites or pneumoperitoneum. Musculoskeletal: No acute abnormalities. IMPRESSION: 1. Ground-glass opacity at the right lower lobe, recommend chest CTA to exclude pulmonary infarct/ischemia. 2. 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 This may reflect a focal  herniation of liver but is indeterminate on this noncontrast study; attention on follow-up. 3. No hydronephrosis or nephrolithiasis. Electronically Signed   By: Marnee SpringJonathon  Watts M.D.   On: 05/02/2020 05:39   US Abdomen Limited RUQ (LIVER/GB)  Result Date: 05/02/2020 CLINICAL DATA:  Right upper quadrant pain EXAM: ULTRASOUND ABDOMEN LIMITED RIGHT UPPER QUADRANT COMPARISON:  None. FINDINGS: Gallbladder: No gallstones or wall thickening visualized. No sonographic Murphy sign noted by sonographer. Common bile duct: Diameter: Normal caliber, 2 mm Liver: No focal lesion identified. Within normal limits in  parenchymal echogenicity. Portal vein is patent on color Doppler imaging with normal direction of blood flow towards the liver. Other: None. IMPRESSION: Unremarkable right upper quadrant ultrasound. Electronically Signed   By: Charlett Nose M.D.   On: 05/02/2020 02:53    EKG: none   Labs on Admission: I have personally reviewed the available labs and imaging studies at the time of the admission.  Pertinent labs:   CO2 21  Glucose 121 WBC 13.3 Hgb 11.9 HCG <5   Assessment/Plan Principal Problem:   Pulmonary embolism (HCC) Active Problems:   Liver mass   Mediastinal lymphadenopathy   -Patient without prior episodes of thromboembolic disease presenting with new B PE with pulmonary ischemia -She denies LE symptoms or recent immobility -She is on the Nuv[place] and so this may have been triggered by her OCP -However, she also has mediastinal and hilar LAD concerning for sarcoidosis vs. Lymphoproliferative disease on CT -She also has a 3.1 cm mass that may be c/w focal liver herniation; however this conglomeration of findings is concerning for underlying malignancy -Will admit on telemetry  -No apparent R heart strain  -Initiate anticoagulation - for now, will start treatment-dose heparin -She is likely able to transition to alternative AC agent in the next 1-2 days -[place] O2 as needed -We  discussed oral AC treatment options and risks/benefits including frequent MD visits and dietary regulation with Coumadin vs. Significant expense with DOAC therapy -Will request TOC team consultation to assist with cost analysis of the various DOAC options based on her insurance -Smoking cessation has been strongly encouraged -Patients are at intermediate risk (3-8%/year) for recurrent VTE if initial clot occurred with use of estrogen.  Extended oral anticoagulation of indefinite duration should be considered for patients with a first episode of PE with these issues. -The patient understands that thromboembolic disease can be catastrophic and even deadly and that she must be complaint with physician appointments and anticoagulation. -Will order abdominal MRI for further liver evaluation. -Given her significant pain, considered morphine PCA (she has already had morphine x 4 mg, x 2 mg, and Fentanyl 50 mcg and continues to writhe in pain) - but she obtained pain control with morphine 2 mg q2h and so this was held -Given possible underlying autoimmune/malignant etiology with abnormal imaging, patient was discussed with Dr. Katrinka Blazing and pulmnology will plan to consult.     Note: This patient has been tested and is pending for the novel coronavirus COVID-19. The patient has been fully vaccinated against COVID-19.   Level of care: Telemetry Medical DVT prophylaxis: Heparin Code Status:  Full - confirmed with patient/family Family Communication: Mother was present throughout evaluation Disposition Plan:  The patient is from: home  Anticipated d/c is to: home without [facility] services   Anticipated d/c date will depend on clinical response to treatment, but possibly as early as tomorrow if she has excellent response to treatment  Patient is currently: acutely ill Consults called: Pulmonology Admission status:  It is my clinical opinion that referral for OBSERVATION is reasonable and necessary in this patient  based on the above information provided. The aforementioned taken together are felt to place the patient at high risk for further clinical deterioration. However it is anticipated that the patient may be medically stable for discharge from the hospital within 24 to 48 hours.    Jonah Blue MD [facility]   How to contact the [facility] Attending or Consulting provider 7A - 7P or covering provider during after hours 7P -7A, for  this patient?  1. Check the care team in [facility] and look for a) attending/consulting [facility] provider listed and b) the [facility] team listed 2. Log into [email or web address] and use [facility] universal password to access. If you do not have the password, please contact the hospital operator. 3. Locate the [facility] provider you are looking for under [facility] and page to a number that you can be directly reached. 4. If you still have difficulty reaching the provider, please page the [facility] (Director on Call) for the Hospitalists listed on amion for assistance.   05/02/2020, 2:00 PM

## 2020-05-02 NOTE — Progress Notes (Signed)
ANTICOAGULATION CONSULT NOTE  Pharmacy Consult for heparin Indication: pulmonary embolus  Heparin Dosing Weight: 65.9 kg  Labs: Recent Labs    05/02/20 0201 05/02/20 1440  HGB 11.9*  --   HCT 35.8*  --   PLT 351  --   HEPARINUNFRC  --  0.49  CREATININE 0.92  --     Assessment: 49 yof presenting with abdominal pain, found to have bilateral acute PE with segmental and lobar clot at RLL causing pulmonary ischemia. Patient is not on anticoagulation PTA. Hg 11.9, plt wnl. No active bleed issues reported.  Estimated weight in the ER is 155 lb and height 5'2.5" per discussion with patient.  Initial heparin level therapeutic at 0.49  Goal of Therapy:  Heparin level 0.3-0.7 units/ml Monitor platelets by anticoagulation protocol: Yes   Plan:  Increase heparin slightly to prevent drop to < 0.3 - > 1250 units / hr Recheck heparin level in 6 hours to confirm therapeutic  Monitor daily CBC, s/sx bleeding  Thank you Okey Regal, PharmD t 05/02/2020 3:33 PM

## 2020-05-02 NOTE — ED Triage Notes (Signed)
Pt coming from home. Pt has right flank pain, started this am, took tylenol w/o relief. Pain increases w/movement. Pt denies nausea, vomiting, diarrhea, constipation or difficulty w/urination.

## 2020-05-02 NOTE — Plan of Care (Signed)

## 2020-05-02 NOTE — Consult Note (Signed)
NAME:  [name], MRN:  [id number], DOB:  [birth year], LOS: 0 ADMISSION DATE:  05/02/2020, CONSULTATION DATE:  05/02/20 REFERRING MD:  Ophelia Charter, CHIEF COMPLAINT:  Flank pain   History of Present Illness:  50 year old woman with history of smoking on birth control presenting with right flank pain found to have pulmonary embolus and abnormal CT chest.  Patient has been losing weight over past year ~30 lbs.  Increased fatigue in past couple days and now having right sided pleurisy.  No recent periods of immobility, no recent traumas or surgeries.  No presyncopal symptoms.  Has immediate family with breast cancer and prostate cancer.  Pertinent  Medical History  Anxiety Fibromyalgia  Interim History / Subjective:  Consulted.  Objective   Blood pressure (!) 141/74, pulse 100, temperature 99.6 F (37.6 C), resp. rate 15, height 5' 2.5" (1.588 m), weight 70.3 kg, SpO2 100 %.       No intake or output data in the 24 hours ending 05/02/20 1416 Filed Weights   05/02/20 0700  Weight: 70.3 kg    Examination: General: no acute distress resting comfortably in bed HENT: MMM, no palpable adenopathy Lungs: clear, no wheezing or rhonci Cardiovascular: tachycardic, ext warm Abdomen: soft, +BS Extremities: no edema, no stigmata of arthritis Neuro: moves all 4 ext to command, good stength Skin: no rashes, has tattoos  Labs/imaging that I have personally reviewed  (right click and "Reselect all SmartList Selections" daily)  CTA chest: personally reviewed and called radiology to re-evaluate: it is now thought the R costophrenic sulcus abnormality is separate from the diaphragm and likely represents a RLL mass.  There is extensive adenopathy mediastinal/hilar bilaterally.  Right pulmonary embolus without RH strain.  Resolved Hospital Problem list   n/a  Assessment & Plan:  Suspect RLL lung mass with extensive mediastinal and hilar adenopathy in smoker concerning for lung cancer- discussed with  radiology at length.  Probable best course here is to give her [facility] for 2 weeks then hold, proceed with EBUS, resume AC, get PET/CT and review at tumor board.  I will arrange all of the above with one of my partners.  Patient should be stable to go home tomorrow on eliquis 10mg  BID x 7 days followed by 5mg  BID.  Will need to stop eliquis ~60 hours prior to bronch.  Best practice (right click and "Reselect all SmartList Selections" daily)  Per primary  Labs   CBC: Recent Labs  Lab 05/02/20 0201  WBC 13.3*  HGB 11.9*  HCT 35.8*  MCV 83.4  PLT 351    Basic Metabolic Panel: Recent Labs  Lab 05/02/20 0201  NA 133*  K 3.6  CL 104  CO2 21*  GLUCOSE 121*  BUN 10  CREATININE 0.92  CALCIUM 9.1   GFR: Estimated Creatinine Clearance: 69.5 mL/min (by C-G formula based on SCr of 0.92 mg/dL). Recent Labs  Lab 05/02/20 0201  WBC 13.3*    Liver Function Tests: Recent Labs  Lab 05/02/20 0201  AST 17  ALT 16  ALKPHOS 108  BILITOT 0.6  PROT 7.2  ALBUMIN 3.4*   Recent Labs  Lab 05/02/20 0201  LIPASE 37   No results for input(s): AMMONIA in the last 168 hours.  ABG No results found for: PHART, PCO2ART, PO2ART, HCO3, TCO2, ACIDBASEDEF, O2SAT   Coagulation Profile: No results for input(s): INR, PROTIME in the last 168 hours.  Cardiac Enzymes: No results for input(s): CKTOTAL, CKMB, CKMBINDEX, TROPONINI in the last 168 hours.  HbA1C: No results found for: HGBA1C  CBG: No results for input(s): GLUCAP in the last 168 hours.  Review of Systems:    Positive Symptoms in bold:  Constitutional fevers, chills, weight loss, fatigue, anorexia, malaise  Eyes decreased vision, double vision, eye irritation  Ears, Nose, Mouth, Throat sore throat, trouble swallowing, sinus congestion  Cardiovascular chest pain, paroxysmal nocturnal dyspnea, lower ext edema, palpitations   Respiratory SOB, cough, DOE, hemoptysis, wheezing  Gastrointestinal nausea, vomiting, diarrhea   Genitourinary burning with urination, trouble urinating  Musculoskeletal joint aches, joint swelling, back pain  Integumentary  rashes, skin lesions  Neurological focal weakness, focal numbness, trouble speaking, headaches  Psychiatric depression, anxiety, confusion  Endocrine polyuria, polydipsia, cold intolerance, heat intolerance  Hematologic abnormal bruising, abnormal bleeding, unexplained nose bleeds  Allergic/Immunologic recurrent infections, hives, swollen lymph nodes     Past Medical History:  She,  has a past medical history of Anxiety, Eczema, Fibromyalgia, Heart murmur, Ovarian cyst, and Urinary, incontinence, stress female.   Surgical History:   Past Surgical History:  Procedure Laterality Date  . CESAREAN SECTION       Social History:   reports that she has been smoking cigarettes. She started smoking about 19 years ago. She has a 14.00 pack-year smoking history. She has never used smokeless tobacco. She reports current alcohol use. She reports current drug use. Drug: Marijuana.   Family History:  Her family history includes Alcoholism in her father and paternal grandmother; Aneurysm in her maternal grandmother; Asthma in her mother; Cancer in an other family member; Colon cancer in her maternal uncle; Colonic polyp in her mother; Diabetes in her mother and paternal grandmother; Fibroids in her maternal grandmother; Heart disease in her maternal grandmother and mother; Hyperlipidemia in her maternal grandmother and mother; Hypertension in her maternal grandmother and mother; Irritable bowel syndrome in her maternal aunt; Kidney disease in her maternal grandfather; Rheum arthritis in her mother; Stroke in her maternal grandmother.   Allergies Allergies  Allergen Reactions  . Latex Rash     Home Medications  Prior to Admission medications   Medication Sig Start Date End Date Taking? Authorizing Provider  acetaminophen (TYLENOL) 500 MG tablet Take 1,000 mg by mouth  every 6 (six) hours as needed for moderate pain or headache.   Yes [provider]  LINZESS 145 MCG CAPS capsule Take 145 mcg by mouth daily as needed (constipation). 06/15/18  Yes [provider]  prenatal vitamin w/FE, FA (PRENATAL 1 + 1) 27-1 MG TABS tablet Take 1 tablet by mouth daily before breakfast. 07/06/19  Yes Brock Bad, MD  varenicline (CHANTIX STARTING MONTH PAK) 0.5 MG X 11 & 1 MG X 42 tablet Take one 0.5 mg tablet by mouth once daily for 3 days, then increase to one 0.5 mg tablet twice daily for 4 days, then increase to one 1 mg tablet twice daily. 09/29/19  Yes Brock Bad, MD

## 2020-05-02 NOTE — Progress Notes (Signed)
ANTICOAGULATION CONSULT NOTE  Pharmacy Consult for heparin Indication: pulmonary embolus  Heparin Dosing Weight: 65.9 kg  Labs: Recent Labs    05/02/20 0201 05/02/20 1440 05/02/20 2151  HGB 11.9*  --   --   HCT 35.8*  --   --   PLT 351  --   --   HEPARINUNFRC  --  0.49 0.27*  CREATININE 0.92  --   --     Assessment: 49 yof presenting with abdominal pain, found to have bilateral acute PE with segmental and lobar clot at RLL causing pulmonary ischemia. Patient is not on anticoagulation PTA. Hg 11.9, plt wnl. No active bleed issues reported.  Estimated weight in the ER is 155 lb and height 5'2.5" per discussion with patient.  Heparin level 0.27 units/ml  Goal of Therapy:  Heparin level 0.3-0.7 units/ml Monitor platelets by anticoagulation protocol: Yes   Plan:  Increase heparin to 1350 units/ml Recheck heparin level in 6 hours to confirm therapeutic  Monitor daily CBC, s/sx bleeding  Thanks for allowing pharmacy to be a part of this patient's care.  Talbert Cage, PharmD Clinical Pharmacist 05/02/2020 10:44 PM

## 2020-05-02 NOTE — Progress Notes (Signed)
ANTICOAGULATION CONSULT NOTE  Pharmacy Consult for heparin Indication: pulmonary embolus  Heparin Dosing Weight: 65.9 kg  Labs: Recent Labs    05/02/20 0201  HGB 11.9*  HCT 35.8*  PLT 351  CREATININE 0.92    Assessment: 49 yof presenting with abdominal pain, found to have bilateral acute PE with segmental and lobar clot at RLL causing pulmonary ischemia. Patient is not on anticoagulation PTA. Hg 11.9, plt wnl. No active bleed issues reported.  Estimated weight in the ER is 155 lb and height 5'2.5" per discussion with patient.  Goal of Therapy:  Heparin level 0.3-0.7 units/ml Monitor platelets by anticoagulation protocol: Yes   Plan:  Heparin 5000 unit bolus Start heparin at 1200 units/h 6h heparin level Monitor daily CBC, s/sx bleeding   [name], PharmD, BCPS Clinical Pharmacist 05/02/2020 7:31 AM

## 2020-05-02 NOTE — ED Provider Notes (Signed)
[facility] EMERGENCY DEPARTMENT Provider Note   CSN: [id number] Arrival date & time: 05/02/20  0147     History Chief Complaint  Patient presents with  . Abdominal Pain    [name] is a 50 y.o. female.  The history is provided by the patient.  Abdominal Pain Pain location:  R flank and RUQ Pain quality: aching   Pain radiates to:  Does not radiate Pain severity:  Severe Onset quality:  Gradual Timing:  Constant Progression:  Waxing and waning Chronicity:  New Relieved by:  Nothing Worsened by:  Nothing Ineffective treatments:  Acetaminophen Associated symptoms: no chest pain, no chills, no constipation, no cough, no diarrhea, no dysuria, no fever, no hematuria, no nausea, no shortness of breath and no vomiting        Past Medical History:  Diagnosis Date  . Anxiety   . Eczema   . Fibromyalgia   . Heart murmur   . Ovarian cyst   . Urinary, incontinence, stress female     Patient Active Problem List   Diagnosis Date Noted  . Chronic constipation 08/21/2018  . Special screening for malignant neoplasms, colon 03/19/2016  . Tubo-ovarian abscess 02/03/2014    Past Surgical History:  Procedure Laterality Date  . CESAREAN SECTION       OB History    Gravida  2   Para  2   Term  2   Preterm  0   AB  0   Living  2     SAB  0   IAB  0   Ectopic  0   Multiple  0   Live Births  2           Family History  Problem Relation Age of Onset  . Diabetes Mother   . Hypertension Mother   . Asthma Mother   . Heart disease Mother   . Hyperlipidemia Mother   . Colonic polyp Mother   . Alcoholism Father   . Fibroids Maternal Grandmother   . Aneurysm Maternal Grandmother   . Heart disease Maternal Grandmother   . Hypertension Maternal Grandmother   . Hyperlipidemia Maternal Grandmother   . Stroke Maternal Grandmother   . Cancer Other        members with gyn ca  . Alcoholism Paternal Grandmother   . Diabetes Paternal  Grandmother   . Colon cancer Maternal Uncle   . Kidney disease Maternal Grandfather   . Irritable bowel syndrome Maternal Aunt     Social History   Tobacco Use  . Smoking status: Current Every Day Smoker    Packs/day: 0.50    Types: Cigarettes  . Smokeless tobacco: Never Used  Vaping Use  . Vaping Use: Never used  Substance Use Topics  . Alcohol use: Yes    Alcohol/week: 0.0 standard drinks    Comment: socially  . Drug use: Yes    Types: Marijuana    Comment: sometimes    Home Medications Prior to Admission medications   Medication Sig Start Date End Date Taking? Authorizing Provider  acetaminophen (TYLENOL) 500 MG tablet Take 1,000 mg by mouth every 6 (six) hours as needed for moderate pain or headache.   Yes [provider]  etonogestrel-ethinyl estradiol (NUVARING) 0.12-0.015 MG/24HR vaginal ring Insert vaginally and leave in place for 3 consecutive weeks, then remove for 1 week. 01/26/20  Yes Brock Bad, MD  LINZESS 145 MCG CAPS capsule Take 145 mcg by mouth daily as needed (  constipation). 06/15/18  Yes [provider]  prenatal vitamin w/FE, FA (PRENATAL 1 + 1) 27-1 MG TABS tablet Take 1 tablet by mouth daily before breakfast. 07/06/19  Yes Brock BadHarper, Charles A, MD  varenicline (CHANTIX CONTINUING MONTH PAK) 1 MG tablet Take 1 tablet (1 mg total) by mouth 2 (two) times daily. 09/29/19  Yes Brock BadHarper, Charles A, MD  varenicline (CHANTIX STARTING MONTH PAK) 0.5 MG X 11 & 1 MG X 42 tablet Take one 0.5 mg tablet by mouth once daily for 3 days, then increase to one 0.5 mg tablet twice daily for 4 days, then increase to one 1 mg tablet twice daily. 09/29/19  Yes Brock BadHarper, Charles A, MD  metroNIDAZOLE (FLAGYL) 500 MG tablet Take 1 tablet (500 mg total) by mouth 2 (two) times daily. Patient not taking: No sig reported 11/22/19   Brock BadHarper, Charles A, MD    Allergies    Latex  Review of Systems   Review of Systems  Constitutional: Negative for chills and fever.  HENT:  Negative for congestion and rhinorrhea.   Respiratory: Negative for cough and shortness of breath.   Cardiovascular: Negative for chest pain and palpitations.  Gastrointestinal: Positive for abdominal pain. Negative for constipation, diarrhea, nausea and vomiting.  Genitourinary: Negative for difficulty urinating, dysuria and hematuria.  Musculoskeletal: Negative for arthralgias and back pain.  Skin: Negative for rash and wound.  Neurological: Negative for light-headedness and headaches.    Physical Exam Updated Vital Signs BP (!) 120/98   Pulse 84   Temp 99.7 F (37.6 C) (Oral)   Resp 16   SpO2 99%   Physical Exam Vitals and nursing note reviewed. Exam conducted with a chaperone present.  Constitutional:      General: She is not in acute distress.    Appearance: Normal appearance.  HENT:     Head: Normocephalic and atraumatic.     Nose: No rhinorrhea.  Eyes:     General:        Right eye: No discharge.        Left eye: No discharge.     Conjunctiva/sclera: Conjunctivae normal.  Cardiovascular:     Rate and Rhythm: Regular rhythm. Tachycardia present.  Pulmonary:     Effort: Pulmonary effort is normal. No respiratory distress.     Breath sounds: No stridor.  Abdominal:     General: Abdomen is flat. There is no distension.     Palpations: Abdomen is soft.     Tenderness: There is abdominal tenderness in the right upper quadrant and epigastric area. There is no right CVA tenderness or guarding. Positive signs include Murphy's sign. Negative signs include Rovsing's sign and McBurney's sign.  Musculoskeletal:        General: No tenderness or signs of injury.  Skin:    General: Skin is warm and dry.  Neurological:     General: No focal deficit present.     Mental Status: She is alert. Mental status is at baseline.     Motor: No weakness.  Psychiatric:        Mood and Affect: Mood normal.        Behavior: Behavior normal.     ED Results / Procedures / Treatments    Labs (all labs ordered are listed, but only abnormal results are displayed) Labs Reviewed  COMPREHENSIVE METABOLIC PANEL - Abnormal; Notable for the following components:      Result Value   Sodium 133 (*)    CO2 21 (*)  Glucose, Bld 121 (*)    Albumin 3.4 (*)    All other components within normal limits  CBC - Abnormal; Notable for the following components:   WBC 13.3 (*)    Hemoglobin 11.9 (*)    HCT 35.8 (*)    All other components within normal limits  URINALYSIS, ROUTINE W REFLEX MICROSCOPIC - Abnormal; Notable for the following components:   Color, Urine AMBER (*)    APPearance CLOUDY (*)    Hgb urine dipstick MODERATE (*)    Ketones, ur 20 (*)    Bacteria, UA MANY (*)    All other components within normal limits  LIPASE, BLOOD  I-STAT BETA HCG BLOOD, ED ([facility], [facility], [facility] ONLY)    EKG None  Radiology CT Renal Stone Study  Result Date: 05/02/2020 CLINICAL DATA:  Kidney stone with right flank pain EXAM: CT ABDOMEN AND PELVIS WITHOUT CONTRAST TECHNIQUE: Multidetector CT imaging of the abdomen and pelvis was performed following the standard protocol without IV contrast. COMPARISON:  Abdomen and pelvis CT with contrast 02/03/2014 FINDINGS: Lower chest: Segmental ground-glass opacity in the right lower lobe. This could be pulmonary ischemia, infection, or mild atelectasis. Mild streaky bilateral lower lobe atelectasis. Soft tissue density nodule at the right base which is likely above the diaphragm measures 2.8 cm. Hepatobiliary: No focal liver abnormality, assuming the above described nodule is extrahepatic.No evidence of biliary obstruction or stone. Pancreas: Unremarkable. Spleen: Unremarkable. Adrenals/Urinary Tract: Negative adrenals. No hydronephrosis or stone. Unremarkable bladder. Stomach/Bowel:  No obstruction. No appendicitis. Vascular/Lymphatic: No acute vascular abnormality. No mass or adenopathy. Reproductive:Vaginal medication ring in place Other: No ascites or  pneumoperitoneum. Musculoskeletal: No acute abnormalities. IMPRESSION: 1. Ground-glass opacity at the right lower lobe, recommend chest CTA to exclude pulmonary infarct/ischemia. 2. 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 3. No hydronephrosis or nephrolithiasis. Electronically Signed   By: Marnee Spring M.D.   On: 05/02/2020 05:39   US Abdomen Limited RUQ (LIVER/GB)  Result Date: 05/02/2020 CLINICAL DATA:  Right upper quadrant pain EXAM: ULTRASOUND ABDOMEN LIMITED RIGHT UPPER QUADRANT COMPARISON:  None. FINDINGS: Gallbladder: No gallstones or wall thickening visualized. No sonographic Murphy sign noted by sonographer. Common bile duct: Diameter: Normal caliber, 2 mm Liver: No focal lesion identified. Within normal limits in parenchymal echogenicity. Portal vein is patent on color Doppler imaging with normal direction of blood flow towards the liver. Other: None. IMPRESSION: Unremarkable right upper quadrant ultrasound. Electronically Signed   By: Charlett Nose M.D.   On: 05/02/2020 02:53    Procedures Procedures   Medications Ordered in ED Medications  iohexol (OMNIPAQUE) 350 MG/ML injection 80 mL (has no administration in time range)  lactated ringers bolus 1,000 mL (0 mLs Intravenous Stopped 05/02/20 0500)  morphine 4 MG/ML injection 4 mg (4 mg Intravenous Given 05/02/20 0258)  ondansetron (ZOFRAN) injection 4 mg (4 mg Intravenous Given 05/02/20 0259)    ED Course  I have reviewed the triage vital signs and the nursing notes.  Pertinent labs & imaging results that were available during my care of the patient were reviewed by me and considered in my medical decision making (see chart for details).    MDM Rules/Calculators/A&P                          Right upper quadrant pain.  Initial concern for  biliary dysfunction.  Will get ultrasound screening labs.   Possible UTI as well.  No infectious signs or symptoms otherwise.  Overall well-appearing with stable vital signs.  Ultrasound unremarkable.  Lab studies show many bacteria but no significant inflammatory changes.  There is blood in the urine.  Possible CT stone study needed.  Patient agrees.  Pain control is optimal for patient symptoms.  Modified regimen.  See PE study as there is possible infarct based on CT stone study after my review and radiology review.  Pt care was handed off to on coming provider at 0700.  Complete history and physical and current plan have been communicated.  Please refer to their note for the remainder of ED care and ultimate disposition.  Pt seen in conjunction with Dr. Stevie Kern  Final Clinical Impression(s) / ED Diagnoses Final diagnoses:  RUQ pain    Rx / DC Orders ED Discharge Orders    None       Sabino Donovan, MD 05/02/20 [phone]

## 2020-05-03 ENCOUNTER — Other Ambulatory Visit (HOSPITAL_COMMUNITY): Payer: Self-pay

## 2020-05-03 DIAGNOSIS — R61 Generalized hyperhidrosis: Secondary | ICD-10-CM | POA: Diagnosis present

## 2020-05-03 DIAGNOSIS — Z8042 Family history of malignant neoplasm of prostate: Secondary | ICD-10-CM | POA: Diagnosis not present

## 2020-05-03 DIAGNOSIS — Z20822 Contact with and (suspected) exposure to covid-19: Secondary | ICD-10-CM | POA: Diagnosis present

## 2020-05-03 DIAGNOSIS — Z833 Family history of diabetes mellitus: Secondary | ICD-10-CM | POA: Diagnosis not present

## 2020-05-03 DIAGNOSIS — R590 Localized enlarged lymph nodes: Secondary | ICD-10-CM

## 2020-05-03 DIAGNOSIS — M797 Fibromyalgia: Secondary | ICD-10-CM | POA: Diagnosis present

## 2020-05-03 DIAGNOSIS — Z91040 Latex allergy status: Secondary | ICD-10-CM | POA: Diagnosis not present

## 2020-05-03 DIAGNOSIS — R911 Solitary pulmonary nodule: Secondary | ICD-10-CM | POA: Diagnosis present

## 2020-05-03 DIAGNOSIS — F17210 Nicotine dependence, cigarettes, uncomplicated: Secondary | ICD-10-CM | POA: Diagnosis present

## 2020-05-03 DIAGNOSIS — Z823 Family history of stroke: Secondary | ICD-10-CM | POA: Diagnosis not present

## 2020-05-03 DIAGNOSIS — Z8371 Family history of colonic polyps: Secondary | ICD-10-CM | POA: Diagnosis not present

## 2020-05-03 DIAGNOSIS — R160 Hepatomegaly, not elsewhere classified: Secondary | ICD-10-CM

## 2020-05-03 DIAGNOSIS — Z825 Family history of asthma and other chronic lower respiratory diseases: Secondary | ICD-10-CM | POA: Diagnosis not present

## 2020-05-03 DIAGNOSIS — Z8261 Family history of arthritis: Secondary | ICD-10-CM | POA: Diagnosis not present

## 2020-05-03 DIAGNOSIS — I2699 Other pulmonary embolism without acute cor pulmonale: Principal | ICD-10-CM

## 2020-05-03 DIAGNOSIS — Z803 Family history of malignant neoplasm of breast: Secondary | ICD-10-CM | POA: Diagnosis not present

## 2020-05-03 DIAGNOSIS — Z79899 Other long term (current) drug therapy: Secondary | ICD-10-CM | POA: Diagnosis not present

## 2020-05-03 DIAGNOSIS — R634 Abnormal weight loss: Secondary | ICD-10-CM | POA: Diagnosis present

## 2020-05-03 DIAGNOSIS — Z6827 Body mass index (BMI) 27.0-27.9, adult: Secondary | ICD-10-CM | POA: Diagnosis not present

## 2020-05-03 DIAGNOSIS — Z800 Family history of malignant neoplasm of digestive organs: Secondary | ICD-10-CM | POA: Diagnosis not present

## 2020-05-03 DIAGNOSIS — Z8349 Family history of other endocrine, nutritional and metabolic diseases: Secondary | ICD-10-CM | POA: Diagnosis not present

## 2020-05-03 DIAGNOSIS — Z8249 Family history of ischemic heart disease and other diseases of the circulatory system: Secondary | ICD-10-CM | POA: Diagnosis not present

## 2020-05-03 DIAGNOSIS — Z79890 Hormone replacement therapy (postmenopausal): Secondary | ICD-10-CM | POA: Diagnosis not present

## 2020-05-03 DIAGNOSIS — F419 Anxiety disorder, unspecified: Secondary | ICD-10-CM | POA: Diagnosis present

## 2020-05-03 LAB — BASIC METABOLIC PANEL
Anion gap: 11 (ref 5–15)
BUN: 5 mg/dL — ABNORMAL LOW (ref 6–20)
CO2: 19 mmol/L — ABNORMAL LOW (ref 22–32)
Calcium: 9.1 mg/dL (ref 8.9–10.3)
Chloride: 106 mmol/L (ref 98–111)
Creatinine, Ser: 0.81 mg/dL (ref 0.44–1.00)
GFR, Estimated: >60 mL/min (ref >60)
Glucose, Bld: 101 mg/dL — ABNORMAL HIGH (ref 70–99)
Potassium: 3.8 mmol/L (ref 3.5–5.1)
Sodium: 136 mmol/L (ref 135–145)

## 2020-05-03 LAB — CBC
HCT: 34.3 % — ABNORMAL LOW (ref 36.0–46.0)
Hemoglobin: 10.9 g/dL — ABNORMAL LOW (ref 12.0–15.0)
MCH: 27.2 pg (ref 26.0–34.0)
MCHC: 31.8 g/dL (ref 30.0–36.0)
MCV: 85.5 fL (ref 80.0–100.0)
Platelets: 296 10*3/uL (ref 150–400)
RBC: 4.01 MIL/uL (ref 3.87–5.11)
RDW: 13.4 % (ref 11.5–15.5)
WBC: 16.4 10*3/uL — ABNORMAL HIGH (ref 4.0–10.5)
nRBC: 0 % (ref 0.0–0.2)

## 2020-05-03 LAB — HEPARIN LEVEL (UNFRACTIONATED)
Heparin Unfractionated: 0.29 IU/mL — ABNORMAL LOW (ref 0.30–0.70)
Heparin Unfractionated: 0.23 IU/mL — ABNORMAL LOW (ref 0.30–0.70)

## 2020-05-03 MED ORDER — APIXABAN 5 MG PO TABS: 5.0000 mg | ORAL_TABLET | Freq: Two times a day (BID) | ORAL | Status: DC

## 2020-05-03 MED ORDER — HYDROCODONE-ACETAMINOPHEN 5-325 MG PO TABS
1.0000 | ORAL_TABLET | ORAL | Status: DC | PRN
  Administered 2020-05-03 – 2020-05-04 (×3): 1 via ORAL
  Filled 2020-05-03 (×3): qty 1

## 2020-05-03 MED ORDER — APIXABAN 5 MG PO TABS
10.0000 mg | ORAL_TABLET | Freq: Two times a day (BID) | ORAL | Status: DC
  Administered 2020-05-03 – 2020-05-04 (×3): 10 mg via ORAL
  Filled 2020-05-03 (×3): qty 2

## 2020-05-03 NOTE — Progress Notes (Signed)
ANTICOAGULATION CONSULT NOTE  Pharmacy Consult for heparin Indication: pulmonary embolus  Heparin Dosing Weight: 65.9 kg  Labs: Recent Labs    05/02/20 0201 05/02/20 1440 05/02/20 2151 05/03/20 0403  HGB 11.9*  --   --  10.9*  HCT 35.8*  --   --  34.3*  PLT 351  --   --  296  HEPARINUNFRC  --  0.49 0.27* 0.29*  CREATININE 0.92  --   --   --     Assessment: 49 yof presenting with abdominal pain, found to have bilateral acute PE with segmental and lobar clot at RLL causing pulmonary ischemia. Patient is not on anticoagulation PTA. Hg 11.9, plt wnl. No active bleed issues reported.  Heparin level still slightly subtherapeutic (0.29) on gtt at 1250 units/hr. (Pharmacist had written note to increase heparin earlier to 1350 units/hr but order never entered). No issues with line or bleeding reported per RN.  Goal of Therapy:  Heparin level 0.3-0.7 units/ml Monitor platelets by anticoagulation protocol: Yes   Plan:  Increase heparin to 1400 units/ml Recheck heparin level in 6 hours   Thanks for allowing pharmacy to be a part of this patient's care.  Christoper Fabian, PharmD, BCPS Please see amion for complete clinical pharmacist phone list 05/03/2020 4:48 AM

## 2020-05-03 NOTE — Discharge Instructions (Addendum)
Information on my medicine - ELIQUIS (apixaban)  This medication education was reviewed with me or my healthcare representative as part of my discharge preparation.    Why was Eliquis prescribed for you? Eliquis was prescribed to treat blood clots that may have been found in the veins of your legs (deep vein thrombosis) or in your lungs (pulmonary embolism) and to reduce the risk of them occurring again.  What do You need to know about Eliquis ? The starting dose is 10 mg (two 5 mg tablets) taken TWICE daily for the FIRST SEVEN (7) DAYS, then on (enter date)  05/10/20  the dose is reduced to ONE 5 mg tablet taken TWICE daily.  Eliquis may be taken with or without food.   Try to take the dose about the same time in the morning and in the evening. If you have difficulty swallowing the tablet whole please discuss with your pharmacist how to take the medication safely.  Take Eliquis exactly as prescribed and DO NOT stop taking Eliquis without talking to the doctor who prescribed the medication.  Stopping may increase your risk of developing a new blood clot.  Refill your prescription before you run out.  After discharge, you should have regular check-up appointments with your healthcare provider that is prescribing your Eliquis.    What do you do if you miss a dose? If a dose of ELIQUIS is not taken at the scheduled time, take it as soon as possible on the same day and twice-daily administration should be resumed. The dose should not be doubled to make up for a missed dose.  Important Safety Information A possible side effect of Eliquis is bleeding. You should call your healthcare provider right away if you experience any of the following: ? Bleeding from an injury or your nose that does not stop. ? Unusual colored urine (red or dark brown) or unusual colored stools (red or black). ? Unusual bruising for unknown reasons. ? A serious fall or if you hit your head (even if there is no  bleeding).  Some medicines may interact with Eliquis and might increase your risk of bleeding or clotting while on Eliquis. To help avoid this, consult your healthcare provider or pharmacist prior to using any new prescription or non-prescription medications, including herbals, vitamins, non-steroidal anti-inflammatory drugs (NSAIDs) and supplements.  This website has more information on Eliquis (apixaban): http://www.eliquis.com/eliquis/home =======================================  Pulmonary Embolism    A pulmonary embolism (PE) is a sudden blockage or decrease of blood flow in one or both lungs. Most blockages come from a blood clot that forms in the vein of a lower leg, thigh, or arm (deep vein thrombosis, DVT) and travels to the lungs. A clot is blood that has thickened into a gel or solid. PE is a dangerous and life-threatening condition that needs to be treated right away.  What are the causes? This condition is usually caused by a blood clot that forms in a vein and moves to the lungs. In rare cases, it may be caused by air, fat, part of a tumor, or other tissue that moves through the veins and into the lungs.  What increases the risk? The following factors may make you more likely to develop this condition: 1. Experiencing a traumatic injury, such as breaking a hip or leg. 2. Having: ? A spinal cord injury. ? Orthopedic surgery, especially hip or knee replacement. ? Any major surgery. ? A stroke. ? DVT. ? Blood clots or blood clotting disease. ?  Long-term (chronic) lung or heart disease. ? Cancer treated with chemotherapy. ? A central venous catheter. 3. Taking medicines that contain estrogen. These include birth control pills and hormone replacement therapy. 4. Being: ? Pregnant. ? In the period of time after your baby is delivered (postpartum). ? Older than age 35. ? Overweight. ? A smoker, especially if you have other risks.  What are the signs or symptoms? Symptoms  of this condition usually start suddenly and include:  Shortness of breath during activity or at rest.  Coughing, coughing up blood, or coughing up blood-tinged mucus.  Chest pain that is often worse with deep breaths.  Rapid or irregular heartbeat.  Feeling light-headed or dizzy.  Fainting.  Feeling anxious.  Fever.  Sweating.  Pain and swelling in a leg. This is a symptom of DVT, which can lead to PE. How is this diagnosed? This condition may be diagnosed based on:  Your medical history.  A physical exam.  Blood tests.  CT pulmonary angiogram. This test checks blood flow in and around your lungs.  Ventilation-perfusion scan, also called a lung VQ scan. This test measures air flow and blood flow to the lungs.  An ultrasound of the legs.  How is this treated? Treatment for this condition depends on many factors, such as the cause of your PE, your risk for bleeding or developing more clots, and other medical conditions you have. Treatment aims to remove, dissolve, or stop blood clots from forming or growing larger. Treatment may include: 1. Medicines, such as: ? Blood thinning medicines (anticoagulants) to stop clots from forming. ? Medicines that dissolve clots (thrombolytics). 2. Procedures, such as: ? Using a flexible tube to remove a blood clot (embolectomy) or to deliver medicine to destroy it (catheter-directed thrombolysis). ? Inserting a filter into a large vein that carries blood to the heart (inferior vena cava). This filter (vena cava filter) catches blood clots before they reach the lungs. ? Surgery to remove the clot (surgical embolectomy). This is rare. You may need a combination of immediate, long-term (up to 3 months after diagnosis), and extended (more than 3 months after diagnosis) treatments. Your treatment may continue for several months (maintenance therapy). You and your health care provider will work together to choose the treatment program that is  best for you.  Follow these instructions at home: Medicines 1. Take over-the-counter and prescription medicines only as told by your health care provider. 2. If you are taking an anticoagulant medicine: ? Take the medicine every day at the same time each day. ? Understand what foods and drugs interact with your medicine. ? Understand the side effects of this medicine, including excessive bruising or bleeding. Ask your health care provider or pharmacist about other side effects.  General instructions  Wear a medical alert bracelet or carry a medical alert card that says you have had a PE and lists what medicines you take.  Ask your health care provider when you may return to your normal activities. Avoid sitting or lying for a long time without moving.  Maintain a healthy weight. Ask your health care provider what weight is healthy for you.  Do not use any products that contain nicotine or tobacco, such as cigarettes, e-cigarettes, and chewing tobacco. If you need help quitting, ask your health care provider.  Talk with your health care provider about any travel plans. It is important to make sure that you are still able to take your medicine while on trips.  Keep all follow-up  visits as told by your health care provider. This is important.  Contact a health care provider if:  You missed a dose of your blood thinner medicine.  Get help right away if: 1. You have: ? New or increased pain, swelling, warmth, or redness in an arm or leg. ? Numbness or tingling in an arm or leg. ? Shortness of breath during activity or at rest. ? A fever. ? Chest pain. ? A rapid or irregular heartbeat. ? A severe headache. ? Vision changes. ? A serious fall or accident, or you hit your head. ? Stomach (abdominal) pain. ? Blood in your vomit, stool, or urine. ? A cut that will not stop bleeding. 2. You cough up blood. 3. You feel light-headed or dizzy. 4. You cannot move your arms or  legs. 5. You are confused or have memory loss.  These symptoms may represent a serious problem that is an emergency. Do not wait to see if the symptoms will go away. Get medical help right away. Call your local emergency services (911 in the [place] Do not drive yourself to the hospital. Summary  A pulmonary embolism (PE) is a sudden blockage or decrease of blood flow in one or both lungs. PE is a dangerous and life-threatening condition that needs to be treated right away.  Treatments for this condition usually include medicines to thin your blood (anticoagulants) or medicines to break apart blood clots (thrombolytics).  If you are given blood thinners, it is important to take the medicine every day at the same time each day.  Understand what foods and drugs interact with any medicines that you are taking.  If you have signs of PE or DVT, call your local emergency services (911 in the [place] This information is not intended to replace advice given to you by your health care provider. Make sure you discuss any questions you have with your health care provider. Document Revised: 10/08/2017 Document Reviewed: 10/08/2017 Elsevier Patient Education  2020 [employer].

## 2020-05-03 NOTE — Progress Notes (Signed)
ANTICOAGULATION CONSULT NOTE  Pharmacy Consult for heparin Indication: pulmonary embolus  Heparin Dosing Weight: 65.9 kg  Labs: Recent Labs    05/02/20 0201 05/02/20 1440 05/02/20 2151 05/03/20 0403 05/03/20 1035  HGB 11.9*  --   --  10.9*  --   HCT 35.8*  --   --  34.3*  --   PLT 351  --   --  296  --   HEPARINUNFRC  --    < > 0.27* 0.29* 0.23*  CREATININE 0.92  --   --  0.81  --    < > = values in this interval not displayed.    Assessment: 49 yof presenting with abdominal pain, found to have bilateral acute PE with segmental and lobar clot at RLL causing pulmonary ischemia. No anticoagulation prior to admission. Pharmacy consulted for heparin.    Heparin level still decreased to 0.23 despite rate increase. Switching to apixaban per MD.   Goal of Therapy:  Heparin level 0.3-0.7 units/ml Monitor platelets by anticoagulation protocol: Yes   Plan:  Stop heparin Start apixaban 10mg  BID x7d (given subtherapeutic HL) then decrease to 5mg  BID  Monitor for signs/symptoms of bleeding     Thanks for allowing pharmacy to be a part of this patient's care.  , PharmD, BCPS, BCCP Clinical Pharmacist  Please check AMION for all [facility] phone numbers After 10:00 PM, call Main Pharmacy [phone]

## 2020-05-03 NOTE — TOC Benefit Eligibility Note (Signed)
Patient Advocate Encounter  Insurance verification completed.    The patient is currently admitted and upon discharge could be taking Eliquis 5 mg.  The current 30 day co-pay is, $40.00.   The patient is currently admitted and upon discharge could be taking Xarelto 20 mg.  The current 30 day co-pay is, $40.00.   The patient is insured through [employer]     Ivi Griffith, CPhT Pharmacy Patient Advocate Specialist [place] [facility] Team Direct Number: [phone]  Fax: [phone]        

## 2020-05-03 NOTE — Plan of Care (Signed)

## 2020-05-03 NOTE — Progress Notes (Signed)
PROGRESS NOTE    [name]  ZOX:[id number] DOB: 10/29/1970 DOA: 05/02/2020 PCP: Gaspar Garbeisovec, Richard W, MD   Brief Narrative:  [name] is a 50 y.o. female with medical history significant of fibromyalgia and anxiety presenting with abdominal pain.  Her menses ended on 4/11; during her menses she was having pain in her R side.  Yesterday, she woke up and her R back/side hurt. No urinary symptoms. She does not usually have issues with periods.  Reports unintentional weight loss, 20 pounds in 9 months, as well as night sweats for the past 4 months.  In the ED CT notable for acute PE with small pulmonary infarct as well as questionable mass.   Assessment & Plan:   Principal Problem:   Pulmonary embolism (HCC) Active Problems:   Liver mass   Mediastinal lymphadenopathy   Intractable pleuritic chest pain in the setting of acute PE Questionably provoked in the setting of hormone therapy and questionable lung lesion, POA -Acute PE noted, continue anticoagulation transition to Eliquis over the next 24 hours -Continue pain control transition from IV morphine to p.o. hydrocodone -Pulmonology following, likely EBUS and biopsy in the next few weeks once PE is stabilized -Currently not requiring additional supplemental oxygen -Smoking cessation has been strongly encouraged   DVT prophylaxis: Heparin drip, transition to Eliquis Code Status: Full Family Communication: Aunt at bedside  Status is: Inpatient  Dispo: The patient is from: Home              Anticipated d/c is to: Home              Anticipated d/c date is: 24 to 48 hours              Patient currently not medically stable for discharge as she continues to require IV pain medication and IV anticoagulation  Consultants:   None  Procedures:   None  Antimicrobials:  None  Subjective: No acute issues or events overnight, chest pain improving but not yet resolved.  Objective: Vitals:   05/02/20 1301 05/02/20  1959 05/02/20 2208 05/03/20 0530  BP: (!) 141/74 (!) 149/71 (!) 149/77 (!) 141/78  Pulse: 100 (!) 113 (!) 104 (!) 105  Resp: 15 20 20 20   Temp: 99.6 F (37.6 C) (!) 100.9 F (38.3 C) 99.4 F (37.4 C) (!) 100.4 F (38 C)  TempSrc:  Oral Oral Oral  SpO2: 100% 97% 97% 96%  Weight:      Height:        Intake/Output Summary (Last 24 hours) at 05/03/2020 0724 Last data filed at 05/03/2020 0700 Gross per 24 hour  Intake 400.35 ml  Output --  Net 400.35 ml   Filed Weights   05/02/20 0700  Weight: 70.3 kg    Examination:  General:  Pleasantly resting in bed, No acute distress. HEENT:  Normocephalic atraumatic.  Sclerae nonicteric, noninjected.  Extraocular movements intact bilaterally. Neck:  Without mass or deformity.  Trachea is midline. Lungs:  Clear to auscultate bilaterally without rhonchi, wheeze, or rales. Heart:  Regular rate and rhythm.  Without murmurs, rubs, or gallops. Abdomen:  Soft, nontender, nondistended.  Without guarding or rebound. Extremities: Without cyanosis, clubbing, edema, or obvious deformity. Vascular:  Dorsalis pedis and posterior tibial pulses palpable bilaterally. Skin:  Warm and dry, no erythema, no ulcerations.   Data Reviewed: I have personally reviewed following labs and imaging studies  CBC: Recent Labs  Lab 05/02/20 0201 05/03/20 0403  WBC 13.3* 16.4*  HGB 11.9*  10.9*  HCT 35.8* 34.3*  MCV 83.4 85.5  PLT 351 296   Basic Metabolic Panel: Recent Labs  Lab 05/02/20 0201 05/03/20 0403  NA 133* 136  K 3.6 3.8  CL 104 106  CO2 21* 19*  GLUCOSE 121* 101*  BUN 10 5*  CREATININE 0.92 0.81  CALCIUM 9.1 9.1   GFR: Estimated Creatinine Clearance: 79 mL/min (by C-G formula based on SCr of 0.81 mg/dL). Liver Function Tests: Recent Labs  Lab 05/02/20 0201  AST 17  ALT 16  ALKPHOS 108  BILITOT 0.6  PROT 7.2  ALBUMIN 3.4*   Recent Labs  Lab 05/02/20 0201  LIPASE 37   No results for input(s): AMMONIA in the last 168  hours. Coagulation Profile: No results for input(s): INR, PROTIME in the last 168 hours. Cardiac Enzymes: No results for input(s): CKTOTAL, CKMB, CKMBINDEX, TROPONINI in the last 168 hours. BNP (last 3 results) No results for input(s): PROBNP in the last 8760 hours. HbA1C: No results for input(s): HGBA1C in the last 72 hours. CBG: No results for input(s): GLUCAP in the last 168 hours. Lipid Profile: No results for input(s): CHOL, HDL, LDLCALC, TRIG, CHOLHDL, LDLDIRECT in the last 72 hours. Thyroid Function Tests: No results for input(s): TSH, T4TOTAL, FREET4, T3FREE, THYROIDAB in the last 72 hours. Anemia Panel: No results for input(s): VITAMINB12, FOLATE, FERRITIN, TIBC, IRON, RETICCTPCT in the last 72 hours. Sepsis Labs: No results for input(s): PROCALCITON, LATICACIDVEN in the last 168 hours.  Recent Results (from the past 240 hour(s))  SARS CORONAVIRUS 2 (TAT 6-24 HRS) Nasopharyngeal Nasopharyngeal Swab     Status: None   Collection Time: 05/02/20  7:47 AM   Specimen: Nasopharyngeal Swab  Result Value Ref Range Status   SARS Coronavirus 2 NEGATIVE NEGATIVE Final    Comment: (NOTE) SARS-CoV-2 target nucleic acids are NOT DETECTED.  The SARS-CoV-2 RNA is generally detectable in upper and lower respiratory specimens during the acute phase of infection. Negative results do not preclude SARS-CoV-2 infection, do not rule out co-infections with other pathogens, and should not be used as the sole basis for treatment or other patient management decisions. Negative results must be combined with clinical observations, patient history, and epidemiological information. The expected result is Negative.  Fact Sheet for Patients: HairSlick.no  Fact Sheet for Healthcare Providers: quierodirigir.com  This test is not yet approved or cleared by the [place] FDA and  has been authorized for detection and/or diagnosis of SARS-CoV-2  by FDA under an Emergency Use Authorization (EUA). This EUA will remain  in effect (meaning this test can be used) for the duration of the COVID-19 declaration under Se ction 564(b)(1) of the Act, 21 U.S.C. section 360bbb-3(b)(1), unless the authorization is terminated or revoked sooner.  Performed at [facility], [address], [place], [place] [address]          Radiology Studies: CT Angio Chest PE W and/or Wo Contrast  Result Date: 05/02/2020 CLINICAL DATA:  Follow-up abnormal abdominal CT EXAM: CT ANGIOGRAPHY CHEST WITH CONTRAST TECHNIQUE: Multidetector CT imaging of the chest was performed using the standard protocol during bolus administration of intravenous contrast. Multiplanar CT image reconstructions and MIPs were obtained to evaluate the vascular anatomy. CONTRAST:  67mL OMNIPAQUE IOHEXOL 350 MG/ML SOLN COMPARISON:  None of the chest FINDINGS: Cardiovascular: Acute appearing pulmonary emboli on both sides, more extensive on the right where there is lobar and segmental involvement in the lower lobe with occlusive clot causing the previously seen pulmonary  ischemia. Basilar segmental clot is seen at the left lower lobe. No right heart strain by RV to LV ratio. No cardiomegaly or pericardial effusion. Normal appearance of the aorta Mediastinum/Nodes: Bilateral hilar and mediastinal adenopathy. Subcarinal nodal tissue is estimated at 16 mm. Bronchial lymph nodes appear prominent diffusely. A delayed phase was obtained through the diaphragm to evaluate the right costophrenic sulcus mass which measures 3.1 cm. This may still reflect a herniation of liver given broad contact with the liver, the shape, and isointensity on this phase. Given the mediastinal adenopathy mass or node of thoracic origin is not excluded. Lungs/Pleura: Ground-glass opacity in the right lower lobe which is attributed to ischemia. No pulmonary edema, effusion, or air leak Upper Abdomen: As described on preceding  noncontrast CT. Musculoskeletal: No acute finding Review of the MIP images confirms the above findings. Critical Value/emergent results were called by telephone at the time of interpretation on 05/02/2020 at 7:20 am to provider Dr Stevie Kern , who verbally acknowledged these results. IMPRESSION: 1. Bilateral acute pulmonary emboli with segmental and lobar clot at the right lower lobe causing pulmonary ischemia. 2. Mediastinal and hilar lymphadenopathy, need outpatient follow-up to evaluate for sarcoid or lymphoproliferative disease. 3. 3.1 cm mass at the right cardiophrenic sulcus. A delayed phase was obtained and focal liver herniation remains the leading consideration but additional workup is recommended given #2. Ultimately, an enhanced MRI or CT surveillance may be needed. Electronically Signed   By: Marnee Spring M.D.   On: 05/02/2020 07:25   MR ABDOMEN W WO CONTRAST  Result Date: 05/02/2020 CLINICAL DATA:  Abdominal mass in right cardiophrenic angle on recent CT. EXAM: MRI ABDOMEN WITHOUT AND WITH CONTRAST TECHNIQUE: Multiplanar multisequence MR imaging of the abdomen was performed both before and after the administration of intravenous contrast. CONTRAST:  9mL GADAVIST GADOBUTROL 1 MMOL/ML IV SOLN COMPARISON:  CT on 05/03/2018 FINDINGS: Lower chest: Infiltrate or atelectasis in both lung bases, right side greater than left. Hepatobiliary: 1.3 cm benign hemangioma is seen in segment 7. A 3.7 cm soft tissue density is seen in the right cardiophrenic angle, which is isointense to normal liver parenchyma on all sequences and is contiguous with the medial liver dome. 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 Gallbladder is unremarkable. No evidence of biliary ductal dilatation. Pancreas:  No mass or inflammatory changes. Spleen:  Within normal limits in size and appearance. Adrenals/Urinary Tract: No masses identified. A sub-cm cyst noted in  upper pole of right kidney appears of no evidence of hydronephrosis. Stomach/Bowel: Visualized portion unremarkable. Vascular/Lymphatic: No pathologically enlarged lymph nodes identified. No abdominal aortic aneurysm. Other:  None. Musculoskeletal:  No suspicious bone lesions identified. 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 No evidence of soft tissue mass. 1.3 cm benign hemangioma in right hepatic lobe. Infiltrate or atelectasis in both lung bases, right side greater than left. Electronically Signed   By: Danae Orleans M.D.   On: 05/02/2020 17:49   CT Renal Stone Study  Result Date: 05/02/2020 CLINICAL DATA:  Kidney stone with right flank pain EXAM: CT ABDOMEN AND PELVIS WITHOUT CONTRAST TECHNIQUE: Multidetector CT imaging of the abdomen and pelvis was performed following the standard protocol without IV contrast. COMPARISON:  Abdomen and pelvis CT with contrast 02/03/2014 FINDINGS: Lower chest: Segmental ground-glass opacity in the right lower lobe. This could be pulmonary ischemia,  infection, or mild atelectasis. Mild streaky bilateral lower lobe atelectasis. Soft tissue density nodule at the right base which is likely above the diaphragm measures 2.8 cm. Hepatobiliary: No focal liver abnormality, assuming the above described nodule is extrahepatic.No evidence of biliary obstruction or stone. Pancreas: Unremarkable. Spleen: Unremarkable. Adrenals/Urinary Tract: Negative adrenals. No hydronephrosis or stone. Unremarkable bladder. Stomach/Bowel:  No obstruction. No appendicitis. Vascular/Lymphatic: No acute vascular abnormality. No mass or adenopathy. Reproductive:Vaginal medication ring in place Other: No ascites or pneumoperitoneum. Musculoskeletal: No acute abnormalities. IMPRESSION: 1. Ground-glass opacity at the right lower lobe, recommend chest CTA to exclude pulmonary  infarct/ischemia. 2. 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 3. No hydronephrosis or nephrolithiasis. Electronically Signed   By: Marnee Spring M.D.   On: 05/02/2020 05:39   US Abdomen Limited RUQ (LIVER/GB)  Result Date: 05/02/2020 CLINICAL DATA:  Right upper quadrant pain EXAM: ULTRASOUND ABDOMEN LIMITED RIGHT UPPER QUADRANT COMPARISON:  None. FINDINGS: Gallbladder: No gallstones or wall thickening visualized. No sonographic Murphy sign noted by sonographer. Common bile duct: Diameter: Normal caliber, 2 mm Liver: No focal lesion identified. Within normal limits in parenchymal echogenicity. Portal vein is patent on color Doppler imaging with normal direction of blood flow towards the liver. Other: None. IMPRESSION: Unremarkable right upper quadrant ultrasound. Electronically Signed   By: Charlett Nose M.D.   On: 05/02/2020 02:53   Scheduled Meds: . docusate sodium  100 mg Oral BID  . sodium chloride flush  3 mL Intravenous Q12H   Continuous Infusions: . heparin 1,400 Units/hr (05/03/20 0527)  . lactated ringers 50 mL/hr at 05/02/20 1145     LOS: 0 days   Time spent:  Azucena Fallen, DO [facility]  If 7PM-7AM, please contact night-coverage [email or web address]  05/03/2020, 7:24 AM

## 2020-05-04 LAB — CBC
HCT: 31.1 % — ABNORMAL LOW (ref 36.0–46.0)
Hemoglobin: 10.3 g/dL — ABNORMAL LOW (ref 12.0–15.0)
MCH: 27.8 pg (ref 26.0–34.0)
MCHC: 33.1 g/dL (ref 30.0–36.0)
MCV: 83.8 fL (ref 80.0–100.0)
Platelets: 334 10*3/uL (ref 150–400)
RBC: 3.71 MIL/uL — ABNORMAL LOW (ref 3.87–5.11)
RDW: 13.2 % (ref 11.5–15.5)
WBC: 15.1 10*3/uL — ABNORMAL HIGH (ref 4.0–10.5)
nRBC: 0 % (ref 0.0–0.2)

## 2020-05-04 LAB — HEPARIN LEVEL (UNFRACTIONATED): Heparin Unfractionated: >1.1 IU/mL — ABNORMAL HIGH (ref 0.30–0.70)

## 2020-05-04 MED ORDER — HYDROCODONE-ACETAMINOPHEN 5-325 MG PO TABS: 1.0000 | ORAL_TABLET | ORAL | 0 refills | Status: AC | PRN

## 2020-05-04 MED ORDER — APIXABAN 5 MG PO TABS: 5.0000 mg | ORAL_TABLET | Freq: Two times a day (BID) | ORAL | 1 refills | Status: AC

## 2020-05-04 MED ORDER — APIXABAN 5 MG PO TABS: 10.0000 mg | ORAL_TABLET | Freq: Two times a day (BID) | ORAL | 0 refills | Status: DC

## 2020-05-04 NOTE — Discharge Summary (Signed)
Physician Discharge Summary  [name]:[id number] DOB: Feb 24, 1970 DOA: 05/02/2020  PCP: Gaspar Garbe, MD  Admit date: 05/02/2020 Discharge date: 05/04/2020  Admitted From: Home Disposition: Home   Recommendations for Outpatient Follow-up:  1. Follow up with PCP in 1-2 weeks 2. Please obtain BMP/CBC in one week 3. Please follow up with pulmonology as discussed  Discharge Condition: Stable CODE STATUS: Full Diet recommendation: As tolerated  Brief/Interim Summary: [name] a 49 y.o.femalewith medical history significant offibromyalgia and anxiety presenting with abdominal pain.Her menses ended on 4/11; during her menses she was having pain in her R side. Yesterday, she woke up and her R back/side hurt. No urinary symptoms.She does not usually have issues with periods.  Reports unintentional weight loss, 20 pounds in 9 months, as well as night sweats for the past 4 months.  In the ED CT notable for acute PE with small pulmonary infarct as well as questionable mass.  Patient admitted as above with acute intractable pleuritic chest pain noted to have bilateral acute PE as well as unspecified lung lesion.  Patient was transitioned off of heparin drip onto p.o. Eliquis without any complications.  Patient's pain has been well controlled on p.o. hydrocodone, has not received an IV morphine dose in greater than 24 hours.  Given patient's resolution of symptoms and now tolerating anticoagulation well will discharge home close follow-up with PCP for further refills, further pain management and evaluation and treatment.  Pulmonology was asked to evaluate the patient given atypical lung lesion on imaging.  Plan for outpatient follow-up with repeat imaging with possible EBUS for biopsy per their expertise.  Patient also has extremely sedentary lifestyle on hormone replacement which increases her risk of blood clots as well but certainly given atypical chest CT there is concern for  underlying malignancy.  Discharge Diagnoses:  Principal Problem:   Pulmonary embolism (HCC) Active Problems:   Liver mass   Mediastinal lymphadenopathy   Acute pulmonary embolism, unspecified pulmonary embolism type, unspecified whether acute cor pulmonale present [facility])    Discharge Instructions  Discharge Instructions    Call MD for:  difficulty breathing, headache or visual disturbances   Complete by: As directed    Call MD for:  extreme fatigue   Complete by: As directed    Diet - low sodium heart healthy   Complete by: As directed    Increase activity slowly   Complete by: As directed      Allergies as of 05/04/2020      Reactions   Latex Rash      Medication List    TAKE these medications   acetaminophen 500 MG tablet Commonly known as: TYLENOL Take 1,000 mg by mouth every 6 (six) hours as needed for moderate pain or headache.   apixaban 5 MG Tabs tablet Commonly known as: ELIQUIS Take 2 tablets (10 mg total) by mouth 2 (two) times daily for 5 days.   apixaban 5 MG Tabs tablet Commonly known as: ELIQUIS Take 1 tablet (5 mg total) by mouth 2 (two) times daily. Start taking on: May 10, 2020   Chantix Starting Month Pak 0.5 MG X 11 & 1 MG X 42 tablet Generic drug: varenicline Take one 0.5 mg tablet by mouth once daily for 3 days, then increase to one 0.5 mg tablet twice daily for 4 days, then increase to one 1 mg tablet twice daily.   HYDROcodone-acetaminophen 5-325 MG tablet Commonly known as: NORCO/VICODIN Take 1 tablet by mouth every 4 (  four) hours as needed for up to 7 days for severe pain or moderate pain.   Linzess 145 MCG Caps capsule Generic drug: linaclotide Take 145 mcg by mouth daily as needed (constipation).   prenatal vitamin w/FE, FA 27-1 MG Tabs tablet Take 1 tablet by mouth daily before breakfast.       Allergies  Allergen Reactions  . Latex Rash    Consultations: Pulmonology  Procedures/Studies: CT Angio Chest PE W and/or Wo  Contrast  Result Date: 05/02/2020 CLINICAL DATA:  Follow-up abnormal abdominal CT EXAM: CT ANGIOGRAPHY CHEST WITH CONTRAST TECHNIQUE: Multidetector CT imaging of the chest was performed using the standard protocol during bolus administration of intravenous contrast. Multiplanar CT image reconstructions and MIPs were obtained to evaluate the vascular anatomy. CONTRAST:  35mL OMNIPAQUE IOHEXOL 350 MG/ML SOLN COMPARISON:  None of the chest FINDINGS: Cardiovascular: Acute appearing pulmonary emboli on both sides, more extensive on the right where there is lobar and segmental involvement in the lower lobe with occlusive clot causing the previously seen pulmonary ischemia. Basilar segmental clot is seen at the left lower lobe. No right heart strain by RV to LV ratio. No cardiomegaly or pericardial effusion. Normal appearance of the aorta Mediastinum/Nodes: Bilateral hilar and mediastinal adenopathy. Subcarinal nodal tissue is estimated at 16 mm. Bronchial lymph nodes appear prominent diffusely. A delayed phase was obtained through the diaphragm to evaluate the right costophrenic sulcus mass which measures 3.1 cm. This may still reflect a herniation of liver given broad contact with the liver, the shape, and isointensity on this phase. Given the mediastinal adenopathy mass or node of thoracic origin is not excluded. Lungs/Pleura: Ground-glass opacity in the right lower lobe which is attributed to ischemia. No pulmonary edema, effusion, or air leak Upper Abdomen: As described on preceding noncontrast CT. Musculoskeletal: No acute finding Review of the MIP images confirms the above findings. Critical Value/emergent results were called by telephone at the time of interpretation on 05/02/2020 at 7:20 am to provider Dr Stevie Kern , who verbally acknowledged these results. IMPRESSION: 1. Bilateral acute pulmonary emboli with segmental and lobar clot at the right lower lobe causing pulmonary ischemia. 2. Mediastinal and hilar  lymphadenopathy, need outpatient follow-up to evaluate for sarcoid or lymphoproliferative disease. 3. 3.1 cm mass at the right cardiophrenic sulcus. A delayed phase was obtained and focal liver herniation remains the leading consideration but additional workup is recommended given #2. Ultimately, an enhanced MRI or CT surveillance may be needed. Electronically Signed   By: Marnee Spring M.D.   On: 05/02/2020 07:25   MR ABDOMEN W WO CONTRAST  Result Date: 05/02/2020 CLINICAL DATA:  Abdominal mass in right cardiophrenic angle on recent CT. EXAM: MRI ABDOMEN WITHOUT AND WITH CONTRAST TECHNIQUE: Multiplanar multisequence MR imaging of the abdomen was performed both before and after the administration of intravenous contrast. CONTRAST:  54mL GADAVIST GADOBUTROL 1 MMOL/ML IV SOLN COMPARISON:  CT on 05/03/2018 FINDINGS: Lower chest: Infiltrate or atelectasis in both lung bases, right side greater than left. Hepatobiliary: 1.3 cm benign hemangioma is seen in segment 7. A 3.7 cm soft tissue density is seen in the right cardiophrenic angle, which is isointense to normal liver parenchyma on all sequences and is contiguous with the medial liver dome. 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 Gallbladder is unremarkable. No evidence of biliary ductal dilatation. Pancreas:  No mass or inflammatory changes. Spleen:  Within normal limits in size and appearance. Adrenals/Urinary Tract:  No masses identified. A sub-cm cyst noted in upper pole of right kidney appears of no evidence of hydronephrosis. Stomach/Bowel: Visualized portion unremarkable. Vascular/Lymphatic: No pathologically enlarged lymph nodes identified. No abdominal aortic aneurysm. Other:  None. Musculoskeletal:  No suspicious bone lesions identified. 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 No evidence of soft tissue mass. 1.3 cm benign hemangioma in right hepatic lobe. Infiltrate or atelectasis in both lung bases, right side greater than left. Electronically Signed   By: Danae Orleans M.D.   On: 05/02/2020 17:49   CT Renal Stone Study  Result Date: 05/02/2020 CLINICAL DATA:  Kidney stone with right flank pain EXAM: CT ABDOMEN AND PELVIS WITHOUT CONTRAST TECHNIQUE: Multidetector CT imaging of the abdomen and pelvis was performed following the standard protocol without IV contrast. COMPARISON:  Abdomen and pelvis CT with contrast 02/03/2014 FINDINGS: Lower chest: Segmental ground-glass opacity in the right lower lobe. This could be pulmonary ischemia, infection, or mild atelectasis. Mild streaky bilateral lower lobe atelectasis. Soft tissue density nodule at the right base which is likely above the diaphragm measures 2.8 cm. Hepatobiliary: No focal liver abnormality, assuming the above described nodule is extrahepatic.No evidence of biliary obstruction or stone. Pancreas: Unremarkable. Spleen: Unremarkable. Adrenals/Urinary Tract: Negative adrenals. No hydronephrosis or stone. Unremarkable bladder. Stomach/Bowel:  No obstruction. No appendicitis. Vascular/Lymphatic: No acute vascular abnormality. No mass or adenopathy. Reproductive:Vaginal medication ring in place Other: No ascites or pneumoperitoneum. Musculoskeletal: No acute abnormalities. IMPRESSION: 1. Ground-glass opacity at the right lower lobe, recommend chest CTA to exclude pulmonary infarct/ischemia. 2. 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 3. No hydronephrosis or nephrolithiasis. Electronically Signed   By: Marnee Spring M.D.   On: 05/02/2020 05:39   US Abdomen Limited RUQ (LIVER/GB)  Result Date: 05/02/2020 CLINICAL DATA:  Right upper quadrant pain EXAM: ULTRASOUND  ABDOMEN LIMITED RIGHT UPPER QUADRANT COMPARISON:  None. FINDINGS: Gallbladder: No gallstones or wall thickening visualized. No sonographic Murphy sign noted by sonographer. Common bile duct: Diameter: Normal caliber, 2 mm Liver: No focal lesion identified. Within normal limits in parenchymal echogenicity. Portal vein is patent on color Doppler imaging with normal direction of blood flow towards the liver. Other: None. IMPRESSION: Unremarkable right upper quadrant ultrasound. Electronically Signed   By: Charlett Nose M.D.   On: 05/02/2020 02:53     Subjective: No acute issues or events overnight tolerating p.o. well pleuritic chest pain essentially resolved no issues ambulating -denies dyspnea.  Discharge Exam: Vitals:   05/04/20 0121 05/04/20 0610  BP:  117/67  Pulse:  81  Resp:  16  Temp: 100.3 F (37.9 C) 98.8 F (37.1 C)  SpO2:  97%   Vitals:   05/03/20 1235 05/03/20 2134 05/04/20 0121 05/04/20 0610  BP: 131/66 134/82  117/67  Pulse: 89 91  81  Resp: Temp:  100.3 F (37.9 C) 100.3 F (37.9 C) 98.8 F (37.1 C)  TempSrc:   Oral   SpO2: 100% 98%  97%  Weight:      Height:        General: Pt is alert, awake, not in acute distress Cardiovascular: RRR, S1/S2 +, no rubs, no gallops Respiratory: CTA bilaterally, no wheezing, no rhonchi Abdominal: Soft, NT, ND, bowel sounds + Extremities: no edema,  no cyanosis    The results of significant diagnostics from this hospitalization (including imaging, microbiology, ancillary and laboratory) are listed below for reference.     Microbiology: Recent Results (from the past 240 hour(s))  SARS CORONAVIRUS 2 (TAT 6-24 HRS) Nasopharyngeal Nasopharyngeal Swab     Status: None   Collection Time: 05/02/20  7:47 AM   Specimen: Nasopharyngeal Swab  Result Value Ref Range Status   SARS Coronavirus 2 NEGATIVE NEGATIVE Final    Comment: (NOTE) SARS-CoV-2 target nucleic acids are NOT DETECTED.  The SARS-CoV-2 RNA is generally  detectable in upper and lower respiratory specimens during the acute phase of infection. Negative results do not preclude SARS-CoV-2 infection, do not rule out co-infections with other pathogens, and should not be used as the sole basis for treatment or other patient management decisions. Negative results must be combined with clinical observations, patient history, and epidemiological information. The expected result is Negative.  Fact Sheet for Patients: HairSlick.nohttps://www.fda.gov/media/138098/download  Fact Sheet for Healthcare Providers: quierodirigir.comhttps://www.fda.gov/media/138095/download  This test is not yet approved or cleared by the [place] FDA and  has been authorized for detection and/or diagnosis of SARS-CoV-2 by FDA under an Emergency Use Authorization (EUA). This EUA will remain  in effect (meaning this test can be used) for the duration of the COVID-19 declaration under Se ction 564(b)(1) of the Act, 21 U.S.C. section 360bbb-3(b)(1), unless the authorization is terminated or revoked sooner.  Performed at [facility], [address] [place], [place] [address]      Labs: BNP (last 3 results) Recent Labs    05/02/20 0731  BNP 12.0   Basic Metabolic Panel: Recent Labs  Lab 05/02/20 0201 05/03/20 0403  NA 133* 136  K 3.6 3.8  CL 104 106  CO2 21* 19*  GLUCOSE 121* 101*  BUN 10 5*  CREATININE 0.92 0.81  CALCIUM 9.1 9.1   Liver Function Tests: Recent Labs  Lab 05/02/20 0201  AST 17  ALT 16  ALKPHOS 108  BILITOT 0.6  PROT 7.2  ALBUMIN 3.4*   Recent Labs  Lab 05/02/20 0201  LIPASE 37   No results for input(s): AMMONIA in the last 168 hours. CBC: Recent Labs  Lab 05/02/20 0201 05/03/20 0403 05/04/20 0131  WBC 13.3* 16.4* 15.1*  HGB 11.9* 10.9* 10.3*  HCT 35.8* 34.3* 31.1*  MCV 83.4 85.5 83.8  PLT 351 296 334   Cardiac Enzymes: No results for input(s): CKTOTAL, CKMB, CKMBINDEX, TROPONINI in the last 168 hours. BNP: Invalid input(s):  POCBNP CBG: No results for input(s): GLUCAP in the last 168 hours. D-Dimer No results for input(s): DDIMER in the last 72 hours. Hgb A1c No results for input(s): HGBA1C in the last 72 hours. Lipid Profile No results for input(s): CHOL, HDL, LDLCALC, TRIG, CHOLHDL, LDLDIRECT in the last 72 hours. Thyroid function studies No results for input(s): TSH, T4TOTAL, T3FREE, THYROIDAB in the last 72 hours.  Invalid input(s): FREET3 Anemia work up No results for input(s): VITAMINB12, FOLATE, FERRITIN, TIBC, IRON, RETICCTPCT in the last 72 hours. Urinalysis    Component Value Date/Time   COLORURINE AMBER (A) 05/02/2020 0153   APPEARANCEUR CLOUDY (A) 05/02/2020 0153   LABSPEC 1.019 05/02/2020 0153   PHURINE 6.0 05/02/2020 0153   GLUCOSEU NEGATIVE 05/02/2020 0153   HGBUR MODERATE (A) 05/02/2020 0153   BILIRUBINUR NEGATIVE 05/02/2020 0153   BILIRUBINUR Negative 01/06/2019 1359   KETONESUR 20 (A) 05/02/2020 0153   PROTEINUR NEGATIVE 05/02/2020 0153   UROBILINOGEN 0.2 01/06/2019 1359  UROBILINOGEN 0.2 02/03/2014 1002   NITRITE NEGATIVE 05/02/2020 0153   LEUKOCYTESUR NEGATIVE 05/02/2020 0153   Sepsis Labs Invalid input(s): PROCALCITONIN,  WBC,  LACTICIDVEN Microbiology Recent Results (from the past 240 hour(s))  SARS CORONAVIRUS 2 (TAT 6-24 HRS) Nasopharyngeal Nasopharyngeal Swab     Status: None   Collection Time: 05/02/20  7:47 AM   Specimen: Nasopharyngeal Swab  Result Value Ref Range Status   SARS Coronavirus 2 NEGATIVE NEGATIVE Final    Comment: (NOTE) SARS-CoV-2 target nucleic acids are NOT DETECTED.  The SARS-CoV-2 RNA is generally detectable in upper and lower respiratory specimens during the acute phase of infection. Negative results do not preclude SARS-CoV-2 infection, do not rule out co-infections with other pathogens, and should not be used as the sole basis for treatment or other patient management decisions. Negative results must be combined with clinical  observations, patient history, and epidemiological information. The expected result is Negative.  Fact Sheet for Patients: HairSlick.no  Fact Sheet for Healthcare Providers: quierodirigir.com  This test is not yet approved or cleared by the [place] FDA and  has been authorized for detection and/or diagnosis of SARS-CoV-2 by FDA under an Emergency Use Authorization (EUA). This EUA will remain  in effect (meaning this test can be used) for the duration of the COVID-19 declaration under Se ction 564(b)(1) of the Act, 21 U.S.C. section 360bbb-3(b)(1), unless the authorization is terminated or revoked sooner.  Performed at [facility], [address], [place], [place] [address]      Time coordinating discharge: Over 30 minutes  SIGNED:   Azucena Fallen, DO [facility] 05/04/2020, 7:43 PM Pager   If 7PM-7AM, please contact night-coverage [email or web address]

## 2020-05-04 NOTE — Progress Notes (Signed)
Pt was alert she received and understood discharge instructions.

## 2020-05-18 ENCOUNTER — Telehealth: Payer: Self-pay | Admitting: Pulmonary Disease

## 2020-05-18 DIAGNOSIS — R590 Localized enlarged lymph nodes: Secondary | ICD-10-CM

## 2020-05-18 DIAGNOSIS — R160 Hepatomegaly, not elsewhere classified: Secondary | ICD-10-CM

## 2020-05-18 NOTE — Telephone Encounter (Signed)
Patient was scheduled for a hospital follow up with Dr. Judeth Horn tomorrow (5/6) at 3pm. In the appointment notes, it mentioned that she needed to have a PET scan before appt. Per her chart, this has not been done. I called her to make sure she hadn't completed the PET anywhere else. She has not. She was ok with cancelling the appt and rescheduling after the PET scan.   I checked her chart for the PET scan. It does appear it was ordered by Dr. Katrinka Blazing but it was ordered while she was in the hospital.   PCCs, can we get her scheduled for the PET scan? Thank you!

## 2020-05-18 NOTE — Telephone Encounter (Addendum)
Dr Katrinka Blazing put in PET order while at the hospital and the way it was placed it didn't hit our workque.  Order was put in as restaging PET and checked with Cherina & should be initial.  She is going to change the order and I will get scheduled.

## 2020-05-18 NOTE — Telephone Encounter (Signed)
I scheduled PET for 5/17 and called pt & gave her appt info.  I have her scheduled to see MH on 5/24.  She wanted to know if she could see him any sooner because she has been out of work and doesn't want to go back until she has follow up.  I told her 5/24 was the first thing I could schedule but I would send message to the nurse and see if there was anything sooner.

## 2020-05-19 ENCOUNTER — Inpatient Hospital Stay: Payer: No Typology Code available for payment source | Admitting: Pulmonary Disease

## 2020-05-22 ENCOUNTER — Encounter: Payer: Self-pay | Admitting: *Deleted

## 2020-05-30 ENCOUNTER — Other Ambulatory Visit: Payer: Self-pay

## 2020-05-30 ENCOUNTER — Ambulatory Visit (HOSPITAL_COMMUNITY)
Admission: RE | Admit: 2020-05-30 | Discharge: 2020-05-30 | Disposition: A | Discharge status: Home / Self Care | Payer: No Typology Code available for payment source | Source: Ambulatory Visit | Attending: Pulmonary Disease | Admitting: Pulmonary Disease

## 2020-05-30 DIAGNOSIS — R160 Hepatomegaly, not elsewhere classified: Secondary | ICD-10-CM | POA: Diagnosis present

## 2020-05-30 DIAGNOSIS — R590 Localized enlarged lymph nodes: Secondary | ICD-10-CM | POA: Diagnosis present

## 2020-05-30 LAB — GLUCOSE, CAPILLARY: Glucose-Capillary: 103 mg/dL — ABNORMAL HIGH (ref 70–99)

## 2020-05-30 MED ORDER — FLUDEOXYGLUCOSE F - 18 (FDG) INJECTION
8.0000 | Freq: Once | INTRAVENOUS | Status: AC | PRN
  Administered 2020-05-30: 7.07 via INTRAVENOUS

## 2020-06-06 ENCOUNTER — Ambulatory Visit (INDEPENDENT_AMBULATORY_CARE_PROVIDER_SITE_OTHER): Payer: No Typology Code available for payment source | Admitting: Pulmonary Disease

## 2020-06-06 ENCOUNTER — Telehealth: Payer: Self-pay | Admitting: Pulmonary Disease

## 2020-06-06 ENCOUNTER — Other Ambulatory Visit: Payer: Self-pay

## 2020-06-06 ENCOUNTER — Encounter: Payer: Self-pay | Admitting: Pulmonary Disease

## 2020-06-06 VITALS — BP 116/76 | HR 85 | Temp 98.2°F | Ht 62.0 in | Wt 149.0 lb

## 2020-06-06 DIAGNOSIS — I2699 Other pulmonary embolism without acute cor pulmonale: Secondary | ICD-10-CM

## 2020-06-06 DIAGNOSIS — R590 Localized enlarged lymph nodes: Secondary | ICD-10-CM | POA: Diagnosis not present

## 2020-06-06 NOTE — Telephone Encounter (Signed)
This would be memorial day and they will not be doing cases this day

## 2020-06-06 NOTE — Telephone Encounter (Signed)
Routing back to Dr. Judeth Horn

## 2020-06-06 NOTE — Progress Notes (Signed)
_0  ID: Bufford Buttner, female    DOB: 1970-01-24, 50 y.o.   MRN: [id number]  Chief Complaint  Patient presents with  . Follow-up    No complaints    Referring provider: Tisovec, Fransico Him, MD  HPI:   50 year old whom we are seeing in follow-up from recent hospitalization for pulmonary embolus and instantly discovered lung abnormality.  Pulmonary note from recent hospitalization reviewed.  H&P and discharge summary reviewed.  Patient was admitted to the hospital with diagnosis bilateral PE with evidence of right infarction.  She had several day history of right-sided pain and discomfort that gradually became bilateral and prompted further evaluation.  CT scan personally reviewed and demonstrated what looks like right sided peripheral infarction as well as lymphadenopathy with on my interpretation rounded infiltrate most likely consistent with liver eventration.  That abnormality could not rule out possibility of lung cancer.  This prompted outpatient PET scan performed last week.  On my interpretation this reveals bilateral hilar and mediastinal lymphadenopathy that is PET avid, peri-infarction mild PET avidity but not as hot as lymph nodes and the more concerning lower lobe rounded opacity appears to be liver on my interpretation.  In addition to this, right inguinal PET avid lymph node as well as PET avid left iliac crest noted.  She denies any respiratory issues.  No wheezing or cough.  She does have intermittent sweats at night but these are not drenching per her report.  She has lost 20 to 30 pounds mostly intentional with diet change.  Subsequently she has gained about 5 pounds since discharge from hospital.  PMH: PE, tobacco abuse Surgical history: C-section Family history: Mother with diabetes, hypertension, CAD, asthma Social history: Lives in [place], in the midst of job transition, recently quit smoking in setting of PE, had increased smoking due to stress of job in the  preceding weeks   Questionaires / Pulmonary Flowsheets:   ACT:  No flowsheet data found.  MMRC: No flowsheet data found.  Epworth:  No flowsheet data found.  Tests:   FENO:  No results found for: NITRICOXIDE  PFT: No flowsheet data found.  WALK:  No flowsheet data found.  Imaging: Personally reviewed and as per EMR discussion of this note NM PET Image Initial (PI) Skull Base To Thigh  Result Date: 05/31/2020 CLINICAL DATA:  Initial treatment strategy for tumor type. EXAM: NUCLEAR MEDICINE PET SKULL BASE TO THIGH TECHNIQUE: 7.1 mCi F-18 FDG was injected intravenously. Full-ring PET imaging was performed from the skull base to thigh after the radiotracer. CT data was obtained and used for attenuation correction and anatomic localization. Fasting blood glucose: 103 mg/dl COMPARISON:  None. FINDINGS: Mediastinal blood pool activity: SUV max 1.6 Liver activity: SUV max NA NECK: No hypermetabolic lymph nodes in the neck. Incidental CT findings: none CHEST: Moderate mediastinal lymphadenopathy is again noted not significantly changed from comparison CT. For example, subcarinal node measures 12 mm compared with 17 mm. Prevascular node measures 12 mm compared with 10 mm. Hilar nodes appears similar. These mediastinal nodes are hypermetabolic. For example, SUV max equal 5.8 in the subcarinal nodal station. Symmetric hilar activity with SUV max equal 4.9. No hypermetabolic supraclavicular nodes or axillary nodes. Nodule along the RIGHT horizontal fissure is unchanged measuring 7 mm. Incidental CT findings: none ABDOMEN/PELVIS: Normal spleen with normal metabolic activity. No hypermetabolic lymph nodes upper abdomen. Mild metabolic activity associated with a RIGHT external iliac lymph node measuring 9 mm (image 159/4) with SUV max equal 3.9. Incidental  CT findings: none SKELETON: Hypermetabolic activity in the LEFT iliac bone with SUV max equal 4.6. No lesion on CT portion exam. Incidental CT  findings: none IMPRESSION: 1. Hypermetabolic mediastinal lymph nodes and single RIGHT external iliac lymph node. Differential includes lymphoproliferative disease versus granulomatous disease such as sarcoidosis. No evidence of pulmonary sarcoidosis. 2. Hypermetabolic activity within the LEFT iliac bone. Query recent bone marrow biopsy? If no biopsy, consider lymphoma or leukemia in differential. Electronically Signed   By: Suzy Bouchard M.D.   On: 05/31/2020 09:42    Lab Results: Personally reviewed and as per EMR CBC    Component Value Date/Time   WBC 15.1 (H) 05/04/2020 0131   RBC 3.71 (L) 05/04/2020 0131   HGB 10.3 (L) 05/04/2020 0131   HCT 31.1 (L) 05/04/2020 0131   PLT 334 05/04/2020 0131   MCV 83.8 05/04/2020 0131   MCH 27.8 05/04/2020 0131   MCHC 33.1 05/04/2020 0131   RDW 13.2 05/04/2020 0131   LYMPHSABS 2.8 02/06/2014 0815   MONOABS 0.6 02/06/2014 0815   EOSABS 0.2 02/06/2014 0815   BASOSABS 0.0 02/06/2014 0815    BMET    Component Value Date/Time   NA 136 05/03/2020 0403   K 3.8 05/03/2020 0403   CL 106 05/03/2020 0403   CO2 19 (L) 05/03/2020 0403   GLUCOSE 101 (H) 05/03/2020 0403   BUN 5 (L) 05/03/2020 0403   CREATININE 0.81 05/03/2020 0403   CALCIUM 9.1 05/03/2020 0403   GFRNONAA >60 05/03/2020 0403   GFRAA >90 02/03/2014 0850    BNP    Component Value Date/Time   BNP 12.0 05/02/2020 0731    ProBNP No results found for: PROBNP  Specialty Problems   None     Allergies  Allergen Reactions  . Latex Rash    Immunization History  Administered Date(s) Administered  . PFIZER(Purple Top)SARS-COV-2 Vaccination 04/01/2019, 04/26/2019    Past Medical History:  Diagnosis Date  . Anxiety   . Eczema   . Fibromyalgia   . Heart murmur   . Ovarian cyst   . Urinary, incontinence, stress female     Tobacco History: Social History   Tobacco Use  Smoking Status Former Smoker  . Packs/day: 1.00  . Years: 14.00  . Pack years: 14.00  . Types:  Cigarettes  . Start date: 2003  . Quit date: 05/02/2020  . Years since quitting: 0.0  Smokeless Tobacco Never Used   Counseling given: Not Answered   Continue to not smoke  Outpatient Encounter Medications as of 06/06/2020  Medication Sig  . acetaminophen (TYLENOL) 500 MG tablet Take 1,000 mg by mouth every 6 (six) hours as needed for moderate pain or headache.  Marland Kitchen apixaban (ELIQUIS) 5 MG TABS tablet Take 1 tablet (5 mg total) by mouth 2 (two) times daily.  Marland Kitchen LINZESS 145 MCG CAPS capsule Take 145 mcg by mouth daily as needed (constipation).  . prenatal vitamin w/FE, FA (PRENATAL 1 + 1) 27-1 MG TABS tablet Take 1 tablet by mouth daily before breakfast.  . varenicline (CHANTIX STARTING MONTH PAK) 0.5 MG X 11 & 1 MG X 42 tablet Take one 0.5 mg tablet by mouth once daily for 3 days, then increase to one 0.5 mg tablet twice daily for 4 days, then increase to one 1 mg tablet twice daily.  Marland Kitchen apixaban (ELIQUIS) 5 MG TABS tablet Take 2 tablets (10 mg total) by mouth 2 (two) times daily for 5 days.   No facility-administered encounter medications on file  as of 06/06/2020.     Review of Systems  Review of Systems  No chest pain with exertion.  No orthopnea or PND.  No dyspnea on exertion.  Comprehensive review of systems otherwise negative. Physical Exam  BP 116/76 (BP Location: Right Arm, Cuff Size: Normal)   Pulse 85   Temp 98.2 F (36.8 C)   Ht _0  (1.575 m)   Wt 149 lb (67.6 kg)   SpO2 97%   BMI 27.25 kg/m   Wt Readings from Last 5 Encounters:  06/06/20 149 lb (67.6 kg)  05/30/20 144 lb 4.7 oz (65.5 kg)  05/02/20 155 lb (70.3 kg)  09/29/19 157 lb 8 oz (71.4 kg)  07/06/19 158 lb 9.6 oz (71.9 kg)    BMI Readings from Last 5 Encounters:  06/06/20 27.25 kg/m  05/30/20 25.97 kg/m  05/02/20 27.90 kg/m  09/29/19 28.35 kg/m  07/06/19 28.55 kg/m     Physical Exam General: Well-appearing, no distress Eyes: EOMI, no icterus Neck: Supple no JVD Cardiovascular: Regular  rate and rhythm, no murmurs Pulmonary: Clear to auscultation bilaterally, no wheeze or crackle, no crackles. Abdomen: Nondistended, bowel sounds present MSK: No synovitis, joint effusion Psych: Normal mood, full affect Neuro: Normal gait, no weakness   Assessment & Plan:   Hilar and mediastinal lymphadenopathy: PET avid.  Differential includes sarcoidosis, lymphoma.  Felt lymphoma is less likely.  Notably also has a right inguinal and left iliac PET uptake.  Discussed at length considerations for biopsy.  Given that lymphoma on the differential excisional biopsy of the inguinal node would be preferred as architecture would be preserved.  However, she prefers least invasive route.  We will perform EBUS in coming days and was sampling of likely station 7 node.  If this gives [place] diagnosis, granulomatous inflammation consistent with sarcoid I think the diagnostic evaluation is done.  If the biopsy is nondiagnostic, would recommend pursuing inguinal lymph node dissection.  Low suspicion for primary lung cancer notably yield for lymphoma on EBUS is low.  We will need to hold anticoagulation 48 hours prior to procedure.  This was discussed with patient.  We will provide further recommendations and timeline once procedure has been scheduled.  Pulmonary embolus: Positive provoked in the setting of NuvaRing and ongoing smoking.  However attritional associated risk with oral contraceptives as opposed to NuvaRing.  She has associated right lower lobe what appears to be infarction with minimal PET avidity on recent scan.  Continue anticoagulation.   No follow-ups on file.   Lanier Clam, MD 06/06/2020  I spent 65 minutes in the care of this patient including face-to-face visit, review of EMR, interpreting test, coordination of care.

## 2020-06-06 NOTE — H&P (View-Only) (Signed)
_0  ID: Bufford Buttner, female    DOB: 1970-01-24, 50 y.o.   MRN: [id number]  Chief Complaint  Patient presents with  . Follow-up    No complaints    Referring provider: Tisovec, Fransico Him, MD  HPI:   50 year old whom we are seeing in follow-up from recent hospitalization for pulmonary embolus and instantly discovered lung abnormality.  Pulmonary note from recent hospitalization reviewed.  H&P and discharge summary reviewed.  Patient was admitted to the hospital with diagnosis bilateral PE with evidence of right infarction.  She had several day history of right-sided pain and discomfort that gradually became bilateral and prompted further evaluation.  CT scan personally reviewed and demonstrated what looks like right sided peripheral infarction as well as lymphadenopathy with on my interpretation rounded infiltrate most likely consistent with liver eventration.  That abnormality could not rule out possibility of lung cancer.  This prompted outpatient PET scan performed last week.  On my interpretation this reveals bilateral hilar and mediastinal lymphadenopathy that is PET avid, peri-infarction mild PET avidity but not as hot as lymph nodes and the more concerning lower lobe rounded opacity appears to be liver on my interpretation.  In addition to this, right inguinal PET avid lymph node as well as PET avid left iliac crest noted.  She denies any respiratory issues.  No wheezing or cough.  She does have intermittent sweats at night but these are not drenching per her report.  She has lost 20 to 30 pounds mostly intentional with diet change.  Subsequently she has gained about 5 pounds since discharge from hospital.  PMH: PE, tobacco abuse Surgical history: C-section Family history: Mother with diabetes, hypertension, CAD, asthma Social history: Lives in [place], in the midst of job transition, recently quit smoking in setting of PE, had increased smoking due to stress of job in the  preceding weeks   Questionaires / Pulmonary Flowsheets:   ACT:  No flowsheet data found.  MMRC: No flowsheet data found.  Epworth:  No flowsheet data found.  Tests:   FENO:  No results found for: NITRICOXIDE  PFT: No flowsheet data found.  WALK:  No flowsheet data found.  Imaging: Personally reviewed and as per EMR discussion of this note NM PET Image Initial (PI) Skull Base To Thigh  Result Date: 05/31/2020 CLINICAL DATA:  Initial treatment strategy for tumor type. EXAM: NUCLEAR MEDICINE PET SKULL BASE TO THIGH TECHNIQUE: 7.1 mCi F-18 FDG was injected intravenously. Full-ring PET imaging was performed from the skull base to thigh after the radiotracer. CT data was obtained and used for attenuation correction and anatomic localization. Fasting blood glucose: 103 mg/dl COMPARISON:  None. FINDINGS: Mediastinal blood pool activity: SUV max 1.6 Liver activity: SUV max NA NECK: No hypermetabolic lymph nodes in the neck. Incidental CT findings: none CHEST: Moderate mediastinal lymphadenopathy is again noted not significantly changed from comparison CT. For example, subcarinal node measures 12 mm compared with 17 mm. Prevascular node measures 12 mm compared with 10 mm. Hilar nodes appears similar. These mediastinal nodes are hypermetabolic. For example, SUV max equal 5.8 in the subcarinal nodal station. Symmetric hilar activity with SUV max equal 4.9. No hypermetabolic supraclavicular nodes or axillary nodes. Nodule along the RIGHT horizontal fissure is unchanged measuring 7 mm. Incidental CT findings: none ABDOMEN/PELVIS: Normal spleen with normal metabolic activity. No hypermetabolic lymph nodes upper abdomen. Mild metabolic activity associated with a RIGHT external iliac lymph node measuring 9 mm (image 159/4) with SUV max equal 3.9. Incidental  CT findings: none SKELETON: Hypermetabolic activity in the LEFT iliac bone with SUV max equal 4.6. No lesion on CT portion exam. Incidental CT  findings: none IMPRESSION: 1. Hypermetabolic mediastinal lymph nodes and single RIGHT external iliac lymph node. Differential includes lymphoproliferative disease versus granulomatous disease such as sarcoidosis. No evidence of pulmonary sarcoidosis. 2. Hypermetabolic activity within the LEFT iliac bone. Query recent bone marrow biopsy? If no biopsy, consider lymphoma or leukemia in differential. Electronically Signed   By: Suzy Bouchard M.D.   On: 05/31/2020 09:42    Lab Results: Personally reviewed and as per EMR CBC    Component Value Date/Time   WBC 15.1 (H) 05/04/2020 0131   RBC 3.71 (L) 05/04/2020 0131   HGB 10.3 (L) 05/04/2020 0131   HCT 31.1 (L) 05/04/2020 0131   PLT 334 05/04/2020 0131   MCV 83.8 05/04/2020 0131   MCH 27.8 05/04/2020 0131   MCHC 33.1 05/04/2020 0131   RDW 13.2 05/04/2020 0131   LYMPHSABS 2.8 02/06/2014 0815   MONOABS 0.6 02/06/2014 0815   EOSABS 0.2 02/06/2014 0815   BASOSABS 0.0 02/06/2014 0815    BMET    Component Value Date/Time   NA 136 05/03/2020 0403   K 3.8 05/03/2020 0403   CL 106 05/03/2020 0403   CO2 19 (L) 05/03/2020 0403   GLUCOSE 101 (H) 05/03/2020 0403   BUN 5 (L) 05/03/2020 0403   CREATININE 0.81 05/03/2020 0403   CALCIUM 9.1 05/03/2020 0403   GFRNONAA >60 05/03/2020 0403   GFRAA >90 02/03/2014 0850    BNP    Component Value Date/Time   BNP 12.0 05/02/2020 0731    ProBNP No results found for: PROBNP  Specialty Problems   None     Allergies  Allergen Reactions  . Latex Rash    Immunization History  Administered Date(s) Administered  . PFIZER(Purple Top)SARS-COV-2 Vaccination 04/01/2019, 04/26/2019    Past Medical History:  Diagnosis Date  . Anxiety   . Eczema   . Fibromyalgia   . Heart murmur   . Ovarian cyst   . Urinary, incontinence, stress female     Tobacco History: Social History   Tobacco Use  Smoking Status Former Smoker  . Packs/day: 1.00  . Years: 14.00  . Pack years: 14.00  . Types:  Cigarettes  . Start date: 2003  . Quit date: 05/02/2020  . Years since quitting: 0.0  Smokeless Tobacco Never Used   Counseling given: Not Answered   Continue to not smoke  Outpatient Encounter Medications as of 06/06/2020  Medication Sig  . acetaminophen (TYLENOL) 500 MG tablet Take 1,000 mg by mouth every 6 (six) hours as needed for moderate pain or headache.  Marland Kitchen apixaban (ELIQUIS) 5 MG TABS tablet Take 1 tablet (5 mg total) by mouth 2 (two) times daily.  Marland Kitchen LINZESS 145 MCG CAPS capsule Take 145 mcg by mouth daily as needed (constipation).  . prenatal vitamin w/FE, FA (PRENATAL 1 + 1) 27-1 MG TABS tablet Take 1 tablet by mouth daily before breakfast.  . varenicline (CHANTIX STARTING MONTH PAK) 0.5 MG X 11 & 1 MG X 42 tablet Take one 0.5 mg tablet by mouth once daily for 3 days, then increase to one 0.5 mg tablet twice daily for 4 days, then increase to one 1 mg tablet twice daily.  Marland Kitchen apixaban (ELIQUIS) 5 MG TABS tablet Take 2 tablets (10 mg total) by mouth 2 (two) times daily for 5 days.   No facility-administered encounter medications on file  as of 06/06/2020.     Review of Systems  Review of Systems  No chest pain with exertion.  No orthopnea or PND.  No dyspnea on exertion.  Comprehensive review of systems otherwise negative. Physical Exam  BP 116/76 (BP Location: Right Arm, Cuff Size: Normal)   Pulse 85   Temp 98.2 F (36.8 C)   Ht _0  (1.575 m)   Wt 149 lb (67.6 kg)   SpO2 97%   BMI 27.25 kg/m   Wt Readings from Last 5 Encounters:  06/06/20 149 lb (67.6 kg)  05/30/20 144 lb 4.7 oz (65.5 kg)  05/02/20 155 lb (70.3 kg)  09/29/19 157 lb 8 oz (71.4 kg)  07/06/19 158 lb 9.6 oz (71.9 kg)    BMI Readings from Last 5 Encounters:  06/06/20 27.25 kg/m  05/30/20 25.97 kg/m  05/02/20 27.90 kg/m  09/29/19 28.35 kg/m  07/06/19 28.55 kg/m     Physical Exam General: Well-appearing, no distress Eyes: EOMI, no icterus Neck: Supple no JVD Cardiovascular: Regular  rate and rhythm, no murmurs Pulmonary: Clear to auscultation bilaterally, no wheeze or crackle, no crackles. Abdomen: Nondistended, bowel sounds present MSK: No synovitis, joint effusion Psych: Normal mood, full affect Neuro: Normal gait, no weakness   Assessment & Plan:   Hilar and mediastinal lymphadenopathy: PET avid.  Differential includes sarcoidosis, lymphoma.  Felt lymphoma is less likely.  Notably also has a right inguinal and left iliac PET uptake.  Discussed at length considerations for biopsy.  Given that lymphoma on the differential excisional biopsy of the inguinal node would be preferred as architecture would be preserved.  However, she prefers least invasive route.  We will perform EBUS in coming days and was sampling of likely station 7 node.  If this gives [place] diagnosis, granulomatous inflammation consistent with sarcoid I think the diagnostic evaluation is done.  If the biopsy is nondiagnostic, would recommend pursuing inguinal lymph node dissection.  Low suspicion for primary lung cancer notably yield for lymphoma on EBUS is low.  We will need to hold anticoagulation 48 hours prior to procedure.  This was discussed with patient.  We will provide further recommendations and timeline once procedure has been scheduled.  Pulmonary embolus: Positive provoked in the setting of NuvaRing and ongoing smoking.  However attritional associated risk with oral contraceptives as opposed to NuvaRing.  She has associated right lower lobe what appears to be infarction with minimal PET avidity on recent scan.  Continue anticoagulation.   No follow-ups on file.   Lanier Clam, MD 06/06/2020  I spent 65 minutes in the care of this patient including face-to-face visit, review of EMR, interpreting test, coordination of care.

## 2020-06-06 NOTE — Telephone Encounter (Signed)
Please schedule the following:  Provider performing procedure:Shatera Rennert R Burman Bruington, MD Diagnosis: mediastinal lymphadenopathy Which side if for nodule / mass? n/a Procedure: EBUS-TBNA  Has patient been spoken to by Provider and given informed consent? yes Anesthesia: general Do you need Fluro? no Duration of procedure: 1 hour Date: 06/12/2020 Alternate Date: n/a  Time: AM preferred Location: [facility] Does patient have OSA? no DM? no Or Latex allergy? no Medication Restriction: n/a Anticoagulate/Antiplatelet: Eliquis - needs to stop prior to procedure last dose the evening of 06/09/20 Pre-op Labs Ordered:determined by Anesthesia Imaging request: n/a (If, SuperDimension CT Chest, please have STAT courier sent to ENDO)  Please coordinate Pre-op COVID Testing

## 2020-06-06 NOTE — Patient Instructions (Addendum)
We decided to biopsy the lymph nodes inside the chest, around the lungs.   My availability is a little sparse - May 30, June 6, June 17. If these do not work let me know and I can have one of my colleagues perform the procedure.  Once we have a date I can give instructions on when to stop the blood thinner before the surgery.

## 2020-06-06 NOTE — Telephone Encounter (Signed)
Called and spoke with Patient.  Patient stated she saw Dr. Judeth Horn today and discussed biopsy and available days. Patient stated 06/12/20 in the morning would work for her, if possible.  Message routed to Dr. Judeth Horn to advise  06/06/20-OV note-  Instructions  We decided to biopsy the lymph nodes inside the chest, around the lungs.   My availability is a little sparse - May 30, June 6, June 17. If these do not work let me know and I can have one of my colleagues perform the procedure.  Once we have a date I can give instructions on when to stop the blood thinner before the surgery.

## 2020-06-07 ENCOUNTER — Telehealth: Payer: Self-pay | Admitting: Pulmonary Disease

## 2020-06-08 NOTE — Telephone Encounter (Signed)
The patient called returning the phone call and would like to schedule the procedure on 06/30/20.

## 2020-06-08 NOTE — Telephone Encounter (Signed)
I called and spoke with patient who was returning a call from the office. Patient is aware of the $29 fee and will drop by the office to pay it and she would like to have the FMLA cover the hospitalization as she went back to work on Monday, 06/05/20. I will route this to patrice as FYI. Patient verbalized understanding.  [name], thanks

## 2020-06-08 NOTE — Telephone Encounter (Signed)
lmtcb for pt. The dates available are: June 6, June 17.

## 2020-06-09 NOTE — Telephone Encounter (Signed)
Perfect- I will prepare FMLA paperwork-pr

## 2020-06-09 NOTE — Telephone Encounter (Signed)
Patient is scheduled for 06/30/20 arrive at 5:30am @ [facility] Case# [id number]   I have left pt a message to call back and schedule covid test and get instructions for meds and when to stop I left the main number incase she calls back tuesday

## 2020-06-09 NOTE — Telephone Encounter (Signed)
Last dose is the evening of 06/27/20

## 2020-06-09 NOTE — Telephone Encounter (Signed)
Ok per [name] pt will need to stop Eliquis 06/27/20 that eveing

## 2020-06-13 NOTE — Telephone Encounter (Signed)
Pt has been given the following info:  COVID Test - 6/14 @ 12:30 PM EBUS-6/17 @ 7:30AM-check in by 5 AM  Pt has been made aware to stop the eliquix on the evening of 6/14.

## 2020-06-13 NOTE — Telephone Encounter (Signed)
Called patient back to let her know to take her last dose of eliquis prior to procedure on 06/27/20. Patient voiced understanding.  Nothing further needed at this time.

## 2020-06-14 NOTE — Telephone Encounter (Signed)
Prepared paperwork for Dr. Judeth Horn -pr

## 2020-06-15 DIAGNOSIS — Z0289 Encounter for other administrative examinations: Secondary | ICD-10-CM

## 2020-06-15 NOTE — Telephone Encounter (Signed)
Rec'd paperwork - sent email to Marisue Ivan to drop charge -pr

## 2020-06-16 NOTE — Telephone Encounter (Signed)
Charge dropped. Called and left message on pt vm advising to call back to make payment or she can come by the office -pr

## 2020-06-21 NOTE — Telephone Encounter (Signed)
Called and spoke to patient - she will come to the office on 06/23/2020 to pay $29 fee. -pr

## 2020-06-23 ENCOUNTER — Telehealth: Payer: Self-pay | Admitting: Pulmonary Disease

## 2020-06-23 NOTE — Telephone Encounter (Signed)
Per Kimmie, pt's new [facility] insurance will go into effect on 6/12 & looks like [phone] will need a a prior auth.  I can not work on this until Monday.  Will hold to complete on Monday.

## 2020-06-26 NOTE — Telephone Encounter (Signed)
PER [employer] PORTAL:  [id number] - No PA is Required Dec ID:  [id number]   Notified Kimmie.  Nothing further needed at this time.

## 2020-06-27 ENCOUNTER — Other Ambulatory Visit (HOSPITAL_COMMUNITY): Payer: No Typology Code available for payment source

## 2020-06-29 ENCOUNTER — Other Ambulatory Visit (HOSPITAL_COMMUNITY)
Admission: RE | Admit: 2020-06-29 | Discharge: 2020-06-29 | Disposition: A | Discharge status: Home / Self Care | Payer: 59 | Source: Ambulatory Visit | Attending: Pulmonary Disease | Admitting: Pulmonary Disease

## 2020-06-29 ENCOUNTER — Other Ambulatory Visit: Payer: Self-pay

## 2020-06-29 ENCOUNTER — Encounter (HOSPITAL_COMMUNITY): Payer: Self-pay | Admitting: Pulmonary Disease

## 2020-06-29 DIAGNOSIS — Z01812 Encounter for preprocedural laboratory examination: Secondary | ICD-10-CM | POA: Insufficient documentation

## 2020-06-29 DIAGNOSIS — Z20822 Contact with and (suspected) exposure to covid-19: Secondary | ICD-10-CM | POA: Insufficient documentation

## 2020-06-29 LAB — SARS CORONAVIRUS 2 (TAT 6-24 HRS): SARS Coronavirus 2: NEGATIVE

## 2020-06-29 NOTE — Anesthesia Preprocedure Evaluation (Addendum)
Anesthesia Evaluation  Patient identified by MRN, date of birth, ID band Patient awake    Reviewed: Allergy & Precautions, NPO status , Patient's Chart, lab work & pertinent test results  Airway Mallampati: II  TM Distance: >3 FB Neck ROM: Full    Dental no notable dental hx. (+) Teeth Intact, Dental Advisory Given   Pulmonary Current SmokerPatient did not abstain from smoking., former smoker, PE PTE 05/02/20 Mediastinal lymphadenopathy   Pulmonary exam normal breath sounds clear to auscultation       Cardiovascular negative cardio ROS Normal cardiovascular exam+ Valvular Problems/Murmurs  Rhythm:Regular Rate:Normal     Neuro/Psych Anxiety  Neuromuscular disease    GI/Hepatic negative GI ROS, Liver mass   Endo/Other  negative endocrine ROS  Renal/GU negative Renal ROS Bladder dysfunction  SUI    Musculoskeletal  (+) Fibromyalgia -Eczema   Abdominal   Peds  Hematology  (+) anemia , Eliquis therapy- last dose 4 days ago   Anesthesia Other Findings   Reproductive/Obstetrics                           Anesthesia Physical Anesthesia Plan  ASA: 2  Anesthesia Plan: General   Post-op Pain Management:    Induction: Intravenous  PONV Risk Score and Plan: 4 or greater and Treatment may vary due to age or medical condition, Midazolam, Ondansetron, Dexamethasone and Scopolamine patch - Pre-op  Airway Management Planned: Oral ETT  Additional Equipment:   Intra-op Plan:   Post-operative Plan: Extubation in OR  Informed Consent: I have reviewed the patients History and Physical, chart, labs and discussed the procedure including the risks, benefits and alternatives for the proposed anesthesia with the patient or authorized representative who has indicated his/her understanding and acceptance.     Dental advisory given  Plan Discussed with: CRNA and Anesthesiologist  Anesthesia Plan  Comments:        Anesthesia Quick Evaluation

## 2020-06-29 NOTE — Progress Notes (Signed)
[name] denies shortness of breath or chest pain. Patient also denies s/s of Covid.  Cynthia Kravitz was tested for Covid today.  Since April , Cynthia Shvartsman has stopped smoking, and has stopped birth control.

## 2020-06-30 ENCOUNTER — Encounter (HOSPITAL_COMMUNITY)
Admission: RE | Disposition: A | Discharge status: Home / Self Care | Payer: Self-pay | Source: Home / Self Care | Attending: Pulmonary Disease

## 2020-06-30 ENCOUNTER — Ambulatory Visit (HOSPITAL_COMMUNITY): Payer: 59 | Admitting: Anesthesiology

## 2020-06-30 ENCOUNTER — Encounter (HOSPITAL_COMMUNITY): Payer: Self-pay | Admitting: Pulmonary Disease

## 2020-06-30 ENCOUNTER — Ambulatory Visit (HOSPITAL_COMMUNITY): Payer: 59

## 2020-06-30 ENCOUNTER — Ambulatory Visit (HOSPITAL_COMMUNITY)
Admission: RE | Admit: 2020-06-30 | Discharge: 2020-06-30 | Disposition: A | Discharge status: Home / Self Care | Payer: 59 | Attending: Pulmonary Disease | Admitting: Pulmonary Disease

## 2020-06-30 ENCOUNTER — Other Ambulatory Visit: Payer: Self-pay

## 2020-06-30 DIAGNOSIS — R590 Localized enlarged lymph nodes: Secondary | ICD-10-CM | POA: Insufficient documentation

## 2020-06-30 DIAGNOSIS — Z9889 Other specified postprocedural states: Secondary | ICD-10-CM

## 2020-06-30 DIAGNOSIS — Z87891 Personal history of nicotine dependence: Secondary | ICD-10-CM | POA: Insufficient documentation

## 2020-06-30 DIAGNOSIS — Z86711 Personal history of pulmonary embolism: Secondary | ICD-10-CM | POA: Diagnosis not present

## 2020-06-30 DIAGNOSIS — Z7901 Long term (current) use of anticoagulants: Secondary | ICD-10-CM | POA: Insufficient documentation

## 2020-06-30 DIAGNOSIS — Z79899 Other long term (current) drug therapy: Secondary | ICD-10-CM | POA: Diagnosis not present

## 2020-06-30 HISTORY — PX: VIDEO BRONCHOSCOPY WITH ENDOBRONCHIAL ULTRASOUND: SHX6177

## 2020-06-30 HISTORY — PX: BRONCHIAL NEEDLE ASPIRATION BIOPSY: SHX5106

## 2020-06-30 HISTORY — DX: Other pulmonary embolism without acute cor pulmonale: I26.99

## 2020-06-30 LAB — CBC
HCT: 37.6 % (ref 36.0–46.0)
Hemoglobin: 12.1 g/dL (ref 12.0–15.0)
MCH: 27.6 pg (ref 26.0–34.0)
MCHC: 32.2 g/dL (ref 30.0–36.0)
MCV: 85.6 fL (ref 80.0–100.0)
Platelets: 344 10*3/uL (ref 150–400)
RBC: 4.39 MIL/uL (ref 3.87–5.11)
RDW: 13.5 % (ref 11.5–15.5)
WBC: 6.1 10*3/uL (ref 4.0–10.5)
nRBC: 0 % (ref 0.0–0.2)

## 2020-06-30 LAB — BASIC METABOLIC PANEL
Anion gap: 6 (ref 5–15)
BUN: 9 mg/dL (ref 6–20)
CO2: 25 mmol/L (ref 22–32)
Calcium: 9 mg/dL (ref 8.9–10.3)
Chloride: 109 mmol/L (ref 98–111)
Creatinine, Ser: 0.76 mg/dL (ref 0.44–1.00)
GFR, Estimated: >60 mL/min (ref >60)
Glucose, Bld: 100 mg/dL — ABNORMAL HIGH (ref 70–99)
Potassium: 4 mmol/L (ref 3.5–5.1)
Sodium: 140 mmol/L (ref 135–145)

## 2020-06-30 LAB — POCT PREGNANCY, URINE: Preg Test, Ur: NEGATIVE

## 2020-06-30 SURGERY — BRONCHOSCOPY, WITH EBUS
Anesthesia: General

## 2020-06-30 MED ORDER — LACTATED RINGERS IV SOLN: INTRAVENOUS | Status: DC

## 2020-06-30 MED ORDER — PROPOFOL 10 MG/ML IV BOLUS
INTRAVENOUS | Status: DC | PRN
  Administered 2020-06-30: 50 mg via INTRAVENOUS
  Administered 2020-06-30: 150 mg via INTRAVENOUS

## 2020-06-30 MED ORDER — DEXAMETHASONE SODIUM PHOSPHATE 10 MG/ML IJ SOLN
INTRAMUSCULAR | Status: DC | PRN
  Administered 2020-06-30: 10 mg via INTRAVENOUS

## 2020-06-30 MED ORDER — OXYCODONE HCL 5 MG PO TABS: 5.0000 mg | ORAL_TABLET | Freq: Once | ORAL | Status: DC | PRN

## 2020-06-30 MED ORDER — OXYCODONE HCL 5 MG/5ML PO SOLN
5.0000 mg | Freq: Once | ORAL | Status: DC | PRN
Start: 2020-06-30 — End: 2020-06-30

## 2020-06-30 MED ORDER — SUGAMMADEX SODIUM 200 MG/2ML IV SOLN
INTRAVENOUS | Status: DC | PRN
  Administered 2020-06-30: 250 mg via INTRAVENOUS

## 2020-06-30 MED ORDER — LACTATED RINGERS IV SOLN: INTRAVENOUS | Status: DC | PRN

## 2020-06-30 MED ORDER — SCOPOLAMINE 1 MG/3DAYS TD PT72
MEDICATED_PATCH | TRANSDERMAL | Status: AC
  Filled 2020-06-30: qty 1

## 2020-06-30 MED ORDER — SCOPOLAMINE 1 MG/3DAYS TD PT72
1.0000 | MEDICATED_PATCH | TRANSDERMAL | Status: DC
  Administered 2020-06-30: 1.5 mg via TRANSDERMAL

## 2020-06-30 MED ORDER — LIDOCAINE 1 % OPTIME INJ - NO CHARGE
INTRAMUSCULAR | Status: DC | PRN
  Administered 2020-06-30: 10 mL

## 2020-06-30 MED ORDER — ROCURONIUM BROMIDE 10 MG/ML (PF) SYRINGE
PREFILLED_SYRINGE | INTRAVENOUS | Status: DC | PRN
  Administered 2020-06-30: 50 mg via INTRAVENOUS

## 2020-06-30 MED ORDER — ONDANSETRON HCL 4 MG/2ML IJ SOLN: 4.0000 mg | Freq: Once | INTRAMUSCULAR | Status: DC | PRN

## 2020-06-30 MED ORDER — MIDAZOLAM HCL 5 MG/5ML IJ SOLN
INTRAMUSCULAR | Status: DC | PRN
  Administered 2020-06-30: 2 mg via INTRAVENOUS

## 2020-06-30 MED ORDER — FENTANYL CITRATE (PF) 100 MCG/2ML IJ SOLN
INTRAMUSCULAR | Status: DC | PRN
  Administered 2020-06-30: 100 ug via INTRAVENOUS

## 2020-06-30 MED ORDER — ONDANSETRON HCL 4 MG/2ML IJ SOLN
INTRAMUSCULAR | Status: DC | PRN
  Administered 2020-06-30: 4 mg via INTRAVENOUS

## 2020-06-30 MED ORDER — LIDOCAINE HCL (PF) 1 % IJ SOLN
INTRAMUSCULAR | Status: AC
  Filled 2020-06-30: qty 30

## 2020-06-30 MED ORDER — FENTANYL CITRATE (PF) 100 MCG/2ML IJ SOLN: 25.0000 ug | INTRAMUSCULAR | Status: DC | PRN

## 2020-06-30 MED ORDER — LIDOCAINE 2% (20 MG/ML) 5 ML SYRINGE
INTRAMUSCULAR | Status: DC | PRN
  Administered 2020-06-30: 60 mg via INTRAVENOUS

## 2020-06-30 NOTE — Interval H&P Note (Signed)
History and Physical Interval Note:  06/30/2020 7:42 AM  [name]  has presented today for surgery, with the diagnosis of MEDIASTINAL LYMPHADENOPATHY.  The various methods of treatment have been discussed with the patient and family. After consideration of risks, benefits and other options for treatment, the patient has consented to  Procedure(s): VIDEO BRONCHOSCOPY WITH ENDOBRONCHIAL ULTRASOUND GUIDED NEEDLE ASPIRATION (N/A) as a surgical intervention.  The patient's history has been reviewed, patient examined, no change in status, stable for surgery.  I have reviewed the patient's chart and labs.  Questions were answered to the patient's satisfaction.     Lesia Sago Rickesha Veracruz

## 2020-06-30 NOTE — Anesthesia Procedure Notes (Signed)
Procedure Name: Intubation Date/Time: 06/30/2020 7:59 AM Performed by: Lanell Matar, CRNA Pre-anesthesia Checklist: Patient identified, Emergency Drugs available, Suction available and Patient being monitored Patient Re-evaluated:Patient Re-evaluated prior to induction Oxygen Delivery Method: Circle System Utilized Preoxygenation: Pre-oxygenation with 100% oxygen Induction Type: IV induction Ventilation: Mask ventilation without difficulty Laryngoscope Size: Glidescope and 3 Grade View: Grade II Tube type: Oral Tube size: 8.5 mm Number of attempts: 2 Airway Equipment and Method: Stylet and Oral airway Placement Confirmation: ETT inserted through vocal cords under direct vision, positive ETCO2 and breath sounds checked- equal and bilateral Secured at: 21 cm Tube secured with: Tape Dental Injury: Teeth and Oropharynx as per pre-operative assessment  Comments: Easy mask. DL with Joneen Boers 2 view. ETT placed in esophagus. Quickly realized and tube removed. DL again with similar view unable to pass ETT.  DL with Go Pro and ETT passed with mild difficulty.

## 2020-06-30 NOTE — Brief Op Note (Signed)
06/30/2020  8:52 AM  PATIENT:  [name]  50 y.o. female  PRE-OPERATIVE DIAGNOSIS:  MEDIASTINAL LYMPHADENOPATHY  POST-OPERATIVE DIAGNOSIS:  * No post-op diagnosis entered *  PROCEDURE:  Procedure(s): VIDEO BRONCHOSCOPY WITH ENDOBRONCHIAL ULTRASOUND GUIDED NEEDLE ASPIRATION (N/A)  SURGEON:  Surgeon(s) and Role:    * Tannya Gonet, Bonna Gains, MD - Primary  PHYSICIAN ASSISTANT:   ASSISTANTS: none   ANESTHESIA:   MAC  EBL:  10 mL   BLOOD ADMINISTERED:none  DRAINS: none   LOCAL MEDICATIONS USED:  LIDOCAINE 1% 10 ml  SPECIMEN:  Fine Needle Aspirate  DISPOSITION OF SPECIMEN:  PATHOLOGY  PLAN OF CARE: Discharge to home after PACU  PATIENT DISPOSITION:  PACU - hemodynamically stable.   Delay start of Pharmacological VTE agent (>24hrs) due to surgical blood loss or risk of bleeding: yes - resume Eliquis 6/18 AM

## 2020-06-30 NOTE — Anesthesia Postprocedure Evaluation (Signed)
Anesthesia Post Note  Patient: [name]  Procedure(s) Performed: VIDEO BRONCHOSCOPY WITH ENDOBRONCHIAL ULTRASOUND GUIDED NEEDLE ASPIRATION     Patient location during evaluation: PACU Anesthesia Type: General Level of consciousness: awake and alert and oriented Pain management: pain level controlled Vital Signs Assessment: post-procedure vital signs reviewed and stable Respiratory status: spontaneous breathing, nonlabored ventilation and respiratory function stable Cardiovascular status: blood pressure returned to baseline and stable Postop Assessment: no apparent nausea or vomiting Anesthetic complications: no   No notable events documented.  Last Vitals:  Vitals:   06/30/20 0900 06/30/20 0915  BP: 123/74 133/78  Pulse: (!) 104 (!) 105  Resp: 18 (!) 24  Temp:  36.6 C  SpO2:  100%    Last Pain:  Vitals:   06/30/20 0915  TempSrc:   PainSc: 0-No pain                 Luvinia Lucy A.

## 2020-06-30 NOTE — Transfer of Care (Signed)
Immediate Anesthesia Transfer of Care Note  Patient: [name]  Procedure(s) Performed: VIDEO BRONCHOSCOPY WITH ENDOBRONCHIAL ULTRASOUND GUIDED NEEDLE ASPIRATION  Patient Location: PACU  Anesthesia Type:General  Level of Consciousness: awake, alert  and oriented  Airway & Oxygen Therapy: Patient Spontanous Breathing and Patient connected to face mask oxygen  Post-op Assessment: Report given to RN and Post -op Vital signs reviewed and stable  Post vital signs: Reviewed and stable  Last Vitals:  Vitals Value Taken Time  BP 130/75 06/30/20 0847  Temp    Pulse 111 06/30/20 0847  Resp 17 06/30/20 0847  SpO2 100 % 06/30/20 0847  Vitals shown include unvalidated device data.  Last Pain:  Vitals:   06/30/20 0639  TempSrc:   PainSc: 0-No pain      Patients Stated Pain Goal: 2 (06/30/20 4967)  Complications: No notable events documented.

## 2020-06-30 NOTE — Op Note (Signed)
Flexible and EBUS Bronchoscopy Procedure Note  [name]  [id number]  01-31-1970  Date:06/30/20  Time:8:54 AM   Provider Performing:Royale Lennartz R Lakyra Tippins   Procedure: Flexible bronchoscopy and EBUS Bronchoscopy  Indication(s) Lymphadenopathy, PET avid  Consent Risks of the procedure as well as the alternatives and risks of each were explained to the patient and/or caregiver.  Consent for the procedure was obtained.  Anesthesia General Anesthesia   Time Out Verified patient identification, verified procedure, site/side was marked, verified correct patient position, special equipment/implants available, medications/allergies/relevant history reviewed, required imaging and test results available.   Sterile Technique Usual hand hygiene, masks, gowns, and gloves were used   Procedure Description Diagnostic bronchoscope advanced through endotracheal tube and into airway.  Airways were examined down to subsegmental level with findings noted below.  The diagnostic bronchoscope was then removed and the EBUS bronchoscope was advanced into airway with stations 7 biopsied and sent for slide, cell blocke.  The EBUS bronchoscope was removed after assuring no active bleeding from biopsy site. Diagnostic brochsoscope was re-introduced and blood suctioned from bilateral airways. No bleeding rom biopsy site was noted. Blood from above ETT was seen entering visual field. This was suctioned. After several seconds no further blood observed, the bronchoscope was removed. Care turned over to anesthesia.   Findings:  --Blood in airways with source above ETT --Normal airway inspection --EBUS-TBNA of station 7 node --Specimens to pathology   Complications/Tolerance None; patient tolerated the procedure well. Chest X-ray is needed post procedure.   EBL Minimal   Specimen(s) To pathology

## 2020-07-02 ENCOUNTER — Encounter (HOSPITAL_COMMUNITY): Payer: Self-pay | Admitting: Pulmonary Disease

## 2020-07-04 LAB — CYTOLOGY - NON PAP

## 2020-07-05 NOTE — Progress Notes (Signed)
Attempted to contact patient regarding biopsy results. Consistent with sarcoid. Went to [employer]. Will try again in coming days.

## 2020-07-06 NOTE — Telephone Encounter (Signed)
Patient never came and paid fee -forms will be sent once patient contacts [place] with payment. Closing encounter -pr

## 2020-07-13 ENCOUNTER — Telehealth: Payer: Self-pay | Admitting: Pulmonary Disease

## 2020-07-14 NOTE — Telephone Encounter (Signed)
Spoke with the pt  I advised will send msg to Dr Judeth Horn to ask about her results from bronch  Please advise, thanks

## 2020-07-18 NOTE — Telephone Encounter (Signed)
Discussed biopsy results, likely sarcoid with patient over phone. No plan or need to treat as of now. Can re-evaluate in the future if new symptoms arise. Counseled importance of yearly ophthalmology visits. She expressed understanding and sees one regularly at present. Routing to Triage as FYI.

## 2020-08-04 ENCOUNTER — Other Ambulatory Visit: Payer: Self-pay

## 2020-08-04 ENCOUNTER — Encounter (HOSPITAL_COMMUNITY): Payer: Self-pay | Admitting: *Deleted

## 2020-08-04 ENCOUNTER — Ambulatory Visit (HOSPITAL_COMMUNITY)
Admission: EM | Admit: 2020-08-04 | Discharge: 2020-08-04 | Disposition: A | Discharge status: Home / Self Care | Payer: 59 | Attending: Emergency Medicine | Admitting: Emergency Medicine

## 2020-08-04 DIAGNOSIS — S161XXA Strain of muscle, fascia and tendon at neck level, initial encounter: Secondary | ICD-10-CM

## 2020-08-04 MED ORDER — TIZANIDINE HCL 4 MG PO TABS: 4.0000 mg | ORAL_TABLET | Freq: Four times a day (QID) | ORAL | 0 refills | Status: AC | PRN

## 2020-08-04 NOTE — Discharge Instructions (Addendum)
You can take the Zanaflex as needed for muscle pain and spasms.  Do not take it prior to driving as it can make you sleepy.    You can take Tylenol as needed for pain. You can use heat (hot showers/baths, warm compress) for comfort.  You may feel sore for a few days.    Return or go to the Emergency Department if symptoms worsen or do not improve in the next few days.
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
## 2020-08-28 ENCOUNTER — Other Ambulatory Visit: Payer: Self-pay

## 2020-08-28 ENCOUNTER — Other Ambulatory Visit (HOSPITAL_COMMUNITY)
Admission: RE | Admit: 2020-08-28 | Discharge: 2020-08-28 | Disposition: A | Discharge status: Home / Self Care | Payer: 59 | Source: Ambulatory Visit | Attending: Obstetrics | Admitting: Obstetrics

## 2020-08-28 ENCOUNTER — Ambulatory Visit (INDEPENDENT_AMBULATORY_CARE_PROVIDER_SITE_OTHER): Payer: 59 | Admitting: Obstetrics

## 2020-08-28 ENCOUNTER — Encounter: Payer: Self-pay | Admitting: Obstetrics

## 2020-08-28 VITALS — BP 132/85 | HR 76 | Ht 63.0 in | Wt 135.9 lb

## 2020-08-28 DIAGNOSIS — F320 Major depressive disorder, single episode, mild: Secondary | ICD-10-CM

## 2020-08-28 DIAGNOSIS — Z3009 Encounter for other general counseling and advice on contraception: Secondary | ICD-10-CM

## 2020-08-28 DIAGNOSIS — Z1239 Encounter for other screening for malignant neoplasm of breast: Secondary | ICD-10-CM | POA: Diagnosis not present

## 2020-08-28 DIAGNOSIS — Z01419 Encounter for gynecological examination (general) (routine) without abnormal findings: Secondary | ICD-10-CM | POA: Insufficient documentation

## 2020-08-28 DIAGNOSIS — Z86711 Personal history of pulmonary embolism: Secondary | ICD-10-CM | POA: Diagnosis not present

## 2020-08-28 DIAGNOSIS — N898 Other specified noninflammatory disorders of vagina: Secondary | ICD-10-CM | POA: Diagnosis present

## 2020-08-28 DIAGNOSIS — F17200 Nicotine dependence, unspecified, uncomplicated: Secondary | ICD-10-CM

## 2020-08-28 MED ORDER — ESCITALOPRAM OXALATE 10 MG PO TABS: 10.0000 mg | ORAL_TABLET | Freq: Every day | ORAL | 11 refills | Status: AC

## 2020-08-28 NOTE — Progress Notes (Signed)
Pt would like to discuss birth control, denies unprotected intercourse in last 2 weeks.

## 2020-08-28 NOTE — Progress Notes (Addendum)
Subjective:        [name] is a 50 y.o. female here for a routine exam.  Current complaints: Vaginal discharge.    Personal health questionnaire:  Is patient Ashkenazi Jewish, have a family history of breast and/or ovarian cancer: no Is there a family history of uterine cancer diagnosed at age < 52, gastrointestinal cancer, urinary tract cancer, family member who is a Personnel officer syndrome-associated carrier: no Is the patient overweight and hypertensive, family history of diabetes, personal history of gestational diabetes, preeclampsia or PCOS: no Is patient over 13, have PCOS,  family history of premature CHD under age 35, diabetes, smoke, have hypertension or peripheral artery disease:  no At any time, has a partner hit, kicked or otherwise hurt or frightened you?: no Over the past 2 weeks, have you felt down, depressed or hopeless?: no Over the past 2 weeks, have you felt little interest or pleasure in doing things?:no   Gynecologic History Patient's last menstrual period was 07/31/2020 (exact date). Contraception: condoms Las Pap: 09-29-2019. Results were: normal Last mammogram: 10-22-2019. Results were: normal  Obstetric History OB History  Gravida Para Term Preterm AB Living  2 2 2  0 0 2  SAB IAB Ectopic Multiple Live Births  0 0 0 0 2    # Outcome Date GA Lbr Len/2nd Weight Sex Delivery Anes PTL Lv  2 Term 03/07/07 [redacted]w[redacted]d   F CS-LTranv Spinal  LIV  1 Term 05/28/91 [redacted]w[redacted]d   M CS-Classical Spinal  LIV    Past Medical History:  Diagnosis Date   Anxiety    Eczema    Fibromyalgia    Heart murmur    "As a teenager, has not been mentioned in years."   Ovarian cyst    Pulmonary embolism (HCC)    Urinary, incontinence, stress female     Past Surgical History:  Procedure Laterality Date   BRONCHIAL NEEDLE ASPIRATION BIOPSY  06/30/2020   Procedure: BRONCHIAL NEEDLE ASPIRATION BIOPSIES;  Surgeon: 07/02/2020, MD;  Location: [facility];  Service: Pulmonary;;    CESAREAN SECTION     general x2 general   VIDEO BRONCHOSCOPY WITH ENDOBRONCHIAL ULTRASOUND N/A 06/30/2020   Procedure: VIDEO BRONCHOSCOPY WITH ENDOBRONCHIAL ULTRASOUND;  Surgeon: 07/02/2020, MD;  Location: [facility];  Service: Pulmonary;  Laterality: N/A;     Current Outpatient Medications:    acetaminophen (TYLENOL) 500 MG tablet, Take 1,000 mg by mouth every 6 (six) hours as needed for moderate pain or headache., Disp: , Rfl:    escitalopram (LEXAPRO) 10 MG tablet, Take 1 tablet (10 mg total) by mouth daily., Disp: 30 tablet, Rfl: 11   LINZESS 145 MCG CAPS capsule, Take 145 mcg by mouth daily as needed (constipation)., Disp: , Rfl:    Multiple Vitamin (MULTIVITAMIN WITH MINERALS) TABS tablet, Take 1 tablet by mouth in the morning., Disp: , Rfl:    tiZANidine (ZANAFLEX) 4 MG tablet, Take 1 tablet (4 mg total) by mouth every 6 (six) hours as needed for muscle spasms., Disp: 30 tablet, Rfl: 0   apixaban (ELIQUIS) 5 MG TABS tablet, Take 1 tablet (5 mg total) by mouth 2 (two) times daily., Disp: 60 tablet, Rfl: 1 Allergies  Allergen Reactions   Chlorhexidine Gluconate Itching and Rash   Latex Rash    Social History   Tobacco Use   Smoking status: Former    Packs/day: 0.25    Years: 14.00    Pack years: 3.50    Types: Cigarettes  Start date: 2003    Quit date: 05/02/2020    Years since quitting: 0.3   Smokeless tobacco: Never  Substance Use Topics   Alcohol use: Yes    Alcohol/week: 2.0 standard drinks    Types: 1 Cans of beer, 1 Shots of liquor per week    Comment: socially    Family History  Problem Relation Age of Onset   Diabetes Mother    Hypertension Mother    Asthma Mother    Heart disease Mother    Hyperlipidemia Mother    Colonic polyp Mother    Rheum arthritis Mother    Alcoholism Father    Fibroids Maternal Grandmother    Aneurysm Maternal Grandmother    Heart disease Maternal Grandmother    Hypertension Maternal Grandmother    Hyperlipidemia  Maternal Grandmother    Stroke Maternal Grandmother    Cancer Other        members with gyn ca; pancreatic   Alcoholism Paternal Grandmother    Diabetes Paternal Grandmother    Colon cancer Maternal Uncle    Kidney disease Maternal Grandfather    Irritable bowel syndrome Maternal Aunt       Review of Systems  Constitutional: negative for fatigue and weight loss Respiratory: negative for cough and wheezing Cardiovascular: negative for chest pain, fatigue and palpitations Gastrointestinal: negative for abdominal pain and change in bowel habits Musculoskeletal:negative for myalgias Neurological: negative for gait problems and tremors Behavioral/Psych: negative for abusive relationship, depression Endocrine: negative for temperature intolerance    Genitourinary: positive for vaginal discharge.  negative for abnormal menstrual periods, genital lesions, hot flashes, sexual problems  Integument/breast: negative for breast lump, breast tenderness, nipple discharge and skin lesion(s)    Objective:       BP 132/85   Pulse 76   Ht 5\' 3"  (1.6 m)   Wt 135 lb 14.4 oz (61.6 kg)   LMP 07/31/2020 (Exact Date)   BMI 24.07 kg/m  General:   Alert and no distress  Skin:   no rash or abnormalities  Lungs:   clear to auscultation bilaterally  Heart:   regular rate and rhythm, S1, S2 normal, no murmur, click, rub or gallop  Breasts:   normal without suspicious masses, skin or nipple changes or axillary nodes  Abdomen:  normal findings: no organomegaly, soft, non-tender and no hernia  Pelvis:  External genitalia: normal general appearance Urinary system: urethral meatus normal and bladder without fullness, nontender Vaginal: normal without tenderness, induration or masses Cervix: normal appearance Adnexa: normal bimanual exam Uterus: anteverted and non-tender, normal size   Lab Review Urine pregnancy test Labs reviewed yes Radiologic studies reviewed yes  I have spent a total of 20  minutes of face-to-face time, excluding clinical staff time, reviewing notes and preparing to see patient, ordering tests and/or medications, and counseling the patient.   Assessment:    1. Encounter for routine gynecological examination with Papanicolaou smear of cervix Rx: - Cytology - PAP  2. Vaginal discharge Rx: - Cervicovaginal ancillary only  3. Screening breast examination Rx: - MM 3D SCREEN BREAST BILATERAL; Future  4. History of pulmonary embolism - had PE while using NuvaRing and smoking tobacco  5. Encounter for other general counseling and advice on contraception - would not recommend any hormonal form of contraception with history of PE - ParaGuard IUD recommended  6. Current mild episode of major depressive disorder without prior episode (HCC) Rx: - escitalopram (LEXAPRO) 10 MG tablet; Take 1 tablet (10 mg total)  by mouth daily.  Dispense: 30 tablet; Refill: 11  7. Tobacco dependence - trying to stop with the aid of medication     Plan:    Education reviewed: calcium supplements, depression evaluation, low fat, low cholesterol diet, safe sex/STD prevention, self breast exams, smoking cessation, and weight bearing exercise. Contraception: condoms. Mammogram ordered. Follow up in: 1 year.for annual.   Follow up in 3 months for depression.  Meds ordered this encounter  Medications   escitalopram (LEXAPRO) 10 MG tablet    Sig: Take 1 tablet (10 mg total) by mouth daily.    Dispense:  30 tablet    Refill:  11   Orders Placed This Encounter  Procedures   MM 3D SCREEN BREAST BILATERAL    Standing Status:   Future    Standing Expiration Date:   08/28/2021    Order Specific Question:   Reason for Exam (SYMPTOM  OR DIAGNOSIS REQUIRED)    Answer:   Needs routine screening    Order Specific Question:   Is the patient pregnant?    Answer:   No    Order Specific Question:   Preferred imaging location?    Answer:   [facility]      Brock Bad,  MD 08/28/2020 9:57 AM

## 2020-08-29 ENCOUNTER — Other Ambulatory Visit: Payer: Self-pay | Admitting: Obstetrics

## 2020-08-29 ENCOUNTER — Telehealth: Payer: Self-pay

## 2020-08-29 DIAGNOSIS — B9689 Other specified bacterial agents as the cause of diseases classified elsewhere: Secondary | ICD-10-CM

## 2020-08-29 LAB — CERVICOVAGINAL ANCILLARY ONLY
Bacterial Vaginitis (gardnerella): POSITIVE — AB
Candida Glabrata: NEGATIVE
Candida Vaginitis: NEGATIVE
Comment: NEGATIVE
Comment: NEGATIVE
Comment: NEGATIVE

## 2020-08-29 MED ORDER — METRONIDAZOLE 500 MG PO TABS: 500.0000 mg | ORAL_TABLET | Freq: Two times a day (BID) | ORAL | 2 refills | Status: DC

## 2020-08-29 NOTE — Telephone Encounter (Signed)
-----   Message from Brock Bad, MD sent at 08/29/2020  1:04 PM EDT ----- Flagyl Rx for BV

## 2020-08-29 NOTE — Telephone Encounter (Signed)
Patient is my chart active.Reviewed test results,medication has been called in.

## 2020-08-30 LAB — CYTOLOGY - PAP
Comment: NEGATIVE
Diagnosis: NEGATIVE
High risk HPV: NEGATIVE

## 2020-10-15 ENCOUNTER — Other Ambulatory Visit: Payer: Self-pay | Admitting: Obstetrics

## 2020-10-24 ENCOUNTER — Ambulatory Visit (HOSPITAL_BASED_OUTPATIENT_CLINIC_OR_DEPARTMENT_OTHER)
Admission: RE | Admit: 2020-10-24 | Discharge: 2020-10-24 | Disposition: A | Discharge status: Home / Self Care | Payer: 59 | Source: Ambulatory Visit | Attending: Obstetrics | Admitting: Obstetrics

## 2020-10-24 ENCOUNTER — Other Ambulatory Visit: Payer: Self-pay

## 2020-10-24 DIAGNOSIS — Z1239 Encounter for other screening for malignant neoplasm of breast: Secondary | ICD-10-CM

## 2020-10-24 DIAGNOSIS — Z1231 Encounter for screening mammogram for malignant neoplasm of breast: Secondary | ICD-10-CM | POA: Insufficient documentation

## 2020-10-30 ENCOUNTER — Other Ambulatory Visit: Payer: Self-pay | Admitting: Obstetrics

## 2020-10-30 DIAGNOSIS — R928 Other abnormal and inconclusive findings on diagnostic imaging of breast: Secondary | ICD-10-CM

## 2020-11-17 ENCOUNTER — Ambulatory Visit
Admission: RE | Admit: 2020-11-17 | Discharge: 2020-11-17 | Disposition: A | Discharge status: Home / Self Care | Payer: 59 | Source: Ambulatory Visit | Attending: Obstetrics | Admitting: Obstetrics

## 2020-11-17 ENCOUNTER — Other Ambulatory Visit: Payer: Self-pay

## 2020-11-17 DIAGNOSIS — R928 Other abnormal and inconclusive findings on diagnostic imaging of breast: Secondary | ICD-10-CM

## 2021-05-16 ENCOUNTER — Other Ambulatory Visit: Payer: Self-pay | Admitting: *Deleted

## 2021-05-16 DIAGNOSIS — B9689 Other specified bacterial agents as the cause of diseases classified elsewhere: Secondary | ICD-10-CM

## 2021-05-16 MED ORDER — METRONIDAZOLE 500 MG PO TABS: 500.0000 mg | ORAL_TABLET | Freq: Two times a day (BID) | ORAL | 2 refills | Status: DC

## 2021-05-16 NOTE — Progress Notes (Signed)
Pt called to office with BV symptoms- request treatment. ?Flagyl ordered today.  ? ?

## 2021-09-19 ENCOUNTER — Other Ambulatory Visit: Payer: Self-pay | Admitting: General Practice

## 2021-09-19 DIAGNOSIS — Z3044 Encounter for surveillance of vaginal ring hormonal contraceptive device: Secondary | ICD-10-CM

## 2021-09-19 MED ORDER — NUVARING 0.12-0.015 MG/24HR VA RING: 1.0000 | VAGINAL_RING | VAGINAL | 1 refills | Status: AC

## 2021-12-16 ENCOUNTER — Other Ambulatory Visit: Payer: Self-pay | Admitting: Obstetrics

## 2021-12-16 DIAGNOSIS — B9689 Other specified bacterial agents as the cause of diseases classified elsewhere: Secondary | ICD-10-CM

## 2022-04-24 ENCOUNTER — Ambulatory Visit (INDEPENDENT_AMBULATORY_CARE_PROVIDER_SITE_OTHER): Payer: 59 | Admitting: Obstetrics

## 2022-04-24 ENCOUNTER — Encounter: Payer: Self-pay | Admitting: Obstetrics

## 2022-04-24 ENCOUNTER — Other Ambulatory Visit (HOSPITAL_COMMUNITY)
Admission: RE | Admit: 2022-04-24 | Discharge: 2022-04-24 | Disposition: A | Discharge status: Home / Self Care | Payer: 59 | Source: Ambulatory Visit | Attending: Obstetrics | Admitting: Obstetrics

## 2022-04-24 ENCOUNTER — Other Ambulatory Visit: Payer: Self-pay

## 2022-04-24 VITALS — BP 132/77 | HR 77 | Ht 62.5 in | Wt 157.9 lb

## 2022-04-24 DIAGNOSIS — Z01419 Encounter for gynecological examination (general) (routine) without abnormal findings: Secondary | ICD-10-CM | POA: Diagnosis present

## 2022-04-24 DIAGNOSIS — N898 Other specified noninflammatory disorders of vagina: Secondary | ICD-10-CM

## 2022-04-24 DIAGNOSIS — Z1239 Encounter for other screening for malignant neoplasm of breast: Secondary | ICD-10-CM

## 2022-04-24 DIAGNOSIS — F17200 Nicotine dependence, unspecified, uncomplicated: Secondary | ICD-10-CM | POA: Diagnosis not present

## 2022-04-24 NOTE — Progress Notes (Unsigned)
Pt is in the office for annual Last pap 08/28/2020 Pt desires STD, no blood work  LMP 04/10/22, irregular cycles Last mammogram 11/17/2020

## 2022-04-24 NOTE — Progress Notes (Unsigned)
Subjective:        [name] is a 52 y.o. female here for a routine exam.  Current complaints: Vaginal discharge .    Personal health questionnaire:  Is patient Ashkenazi Jewish, have a family history of breast and/or ovarian cancer: no Is there a family history of uterine cancer diagnosed at age < 64, gastrointestinal cancer, urinary tract cancer, family member who is a Personnel officer syndrome-associated carrier: no Is the patient overweight and hypertensive, family history of diabetes, personal history of gestational diabetes, preeclampsia or PCOS: no Is patient over 9, have PCOS,  family history of premature CHD under age 31, diabetes, smoke, have hypertension or peripheral artery disease:  no At any time, has a partner hit, kicked or otherwise hurt or frightened you?: no Over the past 2 weeks, have you felt down, depressed or hopeless?: no Over the past 2 weeks, have you felt little interest or pleasure in doing things?:no   Gynecologic History Patient's last menstrual period was 04/10/2022. Contraception: condoms Last Pap: 2022. Results were: normal Last mammogram: 2022. Results were: normal  Obstetric History OB History  Gravida Para Term Preterm AB Living  2 2 2  0 0 2  SAB IAB Ectopic Multiple Live Births  0 0 0 0 2    # Outcome Date GA Lbr Len/2nd Weight Sex Delivery Anes PTL Lv  2 Term 03/07/07 [redacted]w[redacted]d   F CS-LTranv Spinal  LIV  1 Term 05/28/91 [redacted]w[redacted]d   M CS-Classical Spinal  LIV    Past Medical History:  Diagnosis Date   Anxiety    Eczema    Fibromyalgia    Heart murmur    "As a teenager, has not been mentioned in years."   Ovarian cyst    Pulmonary embolism    Urinary, incontinence, stress female     Past Surgical History:  Procedure Laterality Date   BRONCHIAL NEEDLE ASPIRATION BIOPSY  06/30/2020   Procedure: BRONCHIAL NEEDLE ASPIRATION BIOPSIES;  Surgeon: Karren Burly, MD;  Location: [facility];  Service: Pulmonary;;   CESAREAN SECTION      general x2 general   VIDEO BRONCHOSCOPY WITH ENDOBRONCHIAL ULTRASOUND N/A 06/30/2020   Procedure: VIDEO BRONCHOSCOPY WITH ENDOBRONCHIAL ULTRASOUND;  Surgeon: Karren Burly, MD;  Location: [facility];  Service: Pulmonary;  Laterality: N/A;     Current Outpatient Medications:    acetaminophen (TYLENOL) 500 MG tablet, Take 1,000 mg by mouth every 6 (six) hours as needed for moderate pain or headache., Disp: , Rfl:    apixaban (ELIQUIS) 5 MG TABS tablet, Take 1 tablet (5 mg total) by mouth 2 (two) times daily., Disp: 60 tablet, Rfl: 1   Multiple Vitamin (MULTIVITAMIN WITH MINERALS) TABS tablet, Take 1 tablet by mouth in the morning., Disp: , Rfl:    escitalopram (LEXAPRO) 10 MG tablet, Take 1 tablet (10 mg total) by mouth daily. (Patient not taking: Reported on 04/24/2022), Disp: 30 tablet, Rfl: 11   LINZESS 145 MCG CAPS capsule, Take 145 mcg by mouth daily as needed (constipation). (Patient not taking: Reported on 04/24/2022), Disp: , Rfl:    metroNIDAZOLE (FLAGYL) 500 MG tablet, TAKE 1 TABLET(500 MG) BY MOUTH TWICE DAILY (Patient not taking: Reported on 04/24/2022), Disp: 14 tablet, Rfl: 2   NUVARING 0.12-0.015 MG/24HR vaginal ring, Place 1 each vaginally every 28 (twenty-eight) days. (Patient not taking: Reported on 04/24/2022), Disp: 1 each, Rfl: 1   tiZANidine (ZANAFLEX) 4 MG tablet, Take 1 tablet (4 mg total) by mouth every 6 (six) hours  as needed for muscle spasms. (Patient not taking: Reported on 04/24/2022), Disp: 30 tablet, Rfl: 0   varenicline (CHANTIX) 1 MG tablet, TAKE 1 TABLET BY MOUTH TWICE DAILY (Patient not taking: Reported on 04/24/2022), Disp: 60 tablet, Rfl: 0 Allergies  Allergen Reactions   Chlorhexidine Gluconate Itching and Rash   Latex Rash    Social History   Tobacco Use   Smoking status: Every Day    Packs/day: 0.25    Years: 14.00    Additional pack years: 0.00    Total pack years: 3.50    Types: E-cigarettes, Cigarettes    Start date: 2003    Last attempt to  quit: 05/02/2020    Years since quitting: 1.9   Smokeless tobacco: Never  Substance Use Topics   Alcohol use: Yes    Alcohol/week: 2.0 standard drinks of alcohol    Types: 1 Cans of beer, 1 Shots of liquor per week    Comment: socially    Family History  Problem Relation Age of Onset   Diabetes Mother    Hypertension Mother    Asthma Mother    Heart disease Mother    Hyperlipidemia Mother    Colonic polyp Mother    Rheum arthritis Mother    Alcoholism Father    Fibroids Maternal Grandmother    Aneurysm Maternal Grandmother    Heart disease Maternal Grandmother    Hypertension Maternal Grandmother    Hyperlipidemia Maternal Grandmother    Stroke Maternal Grandmother    Kidney disease Maternal Grandfather    Alcoholism Paternal Grandmother    Diabetes Paternal Grandmother    Irritable bowel syndrome Maternal Aunt    Colon cancer Maternal Uncle    Cancer Other        members with gyn ca; pancreatic   Heart Problems Cousin       Review of Systems  Constitutional: negative for fatigue and weight loss Respiratory: negative for cough and wheezing Cardiovascular: negative for chest pain, fatigue and palpitations Gastrointestinal: negative for abdominal pain and change in bowel habits Musculoskeletal:negative for myalgias Neurological: negative for gait problems and tremors Behavioral/Psych: negative for abusive relationship, depression Endocrine: negative for temperature intolerance    Genitourinary:negative for abnormal menstrual periods, genital lesions, hot flashes, sexual problems and vaginal discharge Integument/breast: negative for breast lump, breast tenderness, nipple discharge and skin lesion(s)    Objective:       BP 132/77   Pulse 77   Ht 5' 2.5" (1.588 m)   Wt 157 lb 14.4 oz (71.6 kg)   LMP 04/10/2022   BMI 28.42 kg/m  General:   alert  Skin:   no rash or abnormalities  Lungs:   clear to auscultation bilaterally  Heart:   regular rate and rhythm, S1,  S2 normal, no murmur, click, rub or gallop  Breasts:   normal without suspicious masses, skin or nipple changes or axillary nodes  Abdomen:  normal findings: no organomegaly, soft, non-tender and no hernia  Pelvis:  External genitalia: normal general appearance Urinary system: urethral meatus normal and bladder without fullness, nontender Vaginal: normal without tenderness, induration or masses Cervix: normal appearance Adnexa: normal bimanual exam Uterus: anteverted and non-tender, normal size   Lab Review Urine pregnancy test Labs reviewed yes Radiologic studies reviewed yes  I have spent a total of 20 minutes of face-to-face time, excluding clinical staff time, reviewing notes and preparing to see patient, ordering tests and/or medications, and counseling the patient.  Assessment:    Healthy female exam.  Plan:    Education reviewed: calcium supplements, depression evaluation, low fat, low cholesterol diet, safe sex/STD prevention, self breast exams, smoking cessation, and weight bearing exercise. Contraception: condoms. Mammogram ordered. Follow up in: 1 year.    Orders Placed This Encounter  Procedures   MM 3D SCREENING MAMMOGRAM BILATERAL BREAST    [employer]  10/24/2020 [facility]  NO PROBLEMS/NO NEEDS/ PT AWARE OF $75 CX FEE-AC W/PT    Standing Status:   Future    Standing Expiration Date:   04/24/2023    Order Specific Question:   Reason for Exam (SYMPTOM  OR DIAGNOSIS REQUIRED)    Answer:   Screening    Order Specific Question:   Is the patient pregnant?    Answer:   No    Order Specific Question:   Preferred imaging location?    Answer:   [facility]     Brock Bad, MD 04/25/2022 6:01 PM

## 2022-04-25 LAB — CERVICOVAGINAL ANCILLARY ONLY
Bacterial Vaginitis (gardnerella): NEGATIVE
Candida Glabrata: NEGATIVE
Candida Vaginitis: POSITIVE — AB
Chlamydia: NEGATIVE
Comment: NEGATIVE
Comment: NEGATIVE
Comment: NEGATIVE
Comment: NEGATIVE
Comment: NEGATIVE
Comment: NORMAL
Neisseria Gonorrhea: NEGATIVE
Trichomonas: NEGATIVE

## 2022-04-26 LAB — CYTOLOGY - PAP
Adequacy: ABSENT
Comment: NEGATIVE
Diagnosis: NEGATIVE
High risk HPV: NEGATIVE

## 2022-04-29 ENCOUNTER — Ambulatory Visit
Admission: RE | Admit: 2022-04-29 | Discharge: 2022-04-29 | Disposition: A | Discharge status: Home / Self Care | Payer: 59 | Source: Ambulatory Visit | Attending: Obstetrics | Admitting: Obstetrics

## 2022-04-29 DIAGNOSIS — Z1239 Encounter for other screening for malignant neoplasm of breast: Secondary | ICD-10-CM

## 2022-10-07 ENCOUNTER — Encounter: Payer: Self-pay | Admitting: General Practice

## 2022-10-07 ENCOUNTER — Other Ambulatory Visit: Payer: Self-pay | Admitting: General Practice

## 2022-10-07 ENCOUNTER — Other Ambulatory Visit: Payer: 59

## 2022-10-07 DIAGNOSIS — R109 Unspecified abdominal pain: Secondary | ICD-10-CM

## 2022-10-08 ENCOUNTER — Other Ambulatory Visit: Payer: 59

## 2022-10-08 ENCOUNTER — Ambulatory Visit
Admission: RE | Admit: 2022-10-08 | Discharge: 2022-10-08 | Disposition: A | Discharge status: Home / Self Care | Payer: 59 | Source: Ambulatory Visit | Attending: Nurse Practitioner | Admitting: Nurse Practitioner

## 2022-10-08 DIAGNOSIS — R109 Unspecified abdominal pain: Secondary | ICD-10-CM

## 2022-10-08 MED ORDER — IOPAMIDOL (ISOVUE-300) INJECTION 61%
100.0000 mL | Freq: Once | INTRAVENOUS | Status: AC | PRN
  Administered 2022-10-08: 100 mL via INTRAVENOUS

## 2023-07-28 ENCOUNTER — Ambulatory Visit (INDEPENDENT_AMBULATORY_CARE_PROVIDER_SITE_OTHER): Admitting: Obstetrics

## 2023-07-28 ENCOUNTER — Other Ambulatory Visit (HOSPITAL_COMMUNITY)
Admission: RE | Admit: 2023-07-28 | Discharge: 2023-07-28 | Disposition: A | Discharge status: Home / Self Care | Source: Ambulatory Visit | Attending: Obstetrics | Admitting: Obstetrics

## 2023-07-28 ENCOUNTER — Encounter: Payer: Self-pay | Admitting: Obstetrics

## 2023-07-28 VITALS — BP 134/75 | HR 82 | Ht 63.0 in | Wt 153.4 lb

## 2023-07-28 DIAGNOSIS — Z01419 Encounter for gynecological examination (general) (routine) without abnormal findings: Secondary | ICD-10-CM | POA: Diagnosis present

## 2023-07-28 DIAGNOSIS — N898 Other specified noninflammatory disorders of vagina: Secondary | ICD-10-CM

## 2023-07-28 DIAGNOSIS — F419 Anxiety disorder, unspecified: Secondary | ICD-10-CM

## 2023-07-28 DIAGNOSIS — N951 Menopausal and female climacteric states: Secondary | ICD-10-CM

## 2023-07-28 DIAGNOSIS — F17200 Nicotine dependence, unspecified, uncomplicated: Secondary | ICD-10-CM

## 2023-07-28 DIAGNOSIS — Z1239 Encounter for other screening for malignant neoplasm of breast: Secondary | ICD-10-CM

## 2023-07-28 NOTE — Progress Notes (Signed)
 Pt presents for annal, pt has questions about menopause. Declines std testing. Pt needs a referral to [facility] - Cah = 9/ GAD-7 = 6

## 2023-07-28 NOTE — Progress Notes (Signed)
 Subjective:        [name] is a 53 y.o. female here for a routine exam.  Current complaints: Vaginal discharge and hot flashes.  Personal health questionnaire:  Is patient Ashkenazi Jewish, have a family history of breast and/or ovarian cancer: no Is there a family history of uterine cancer diagnosed at age < 24, gastrointestinal cancer, urinary tract cancer, family member who is a Personnel officer syndrome-associated carrier: yes Is the patient overweight and hypertensive, family history of diabetes, personal history of gestational diabetes, preeclampsia or PCOS: no Is patient over 58, have PCOS,  family history of premature CHD under age 32, diabetes, smoke, have hypertension or peripheral artery disease:  no At any time, has a partner hit, kicked or otherwise hurt or frightened you?: no Over the past 2 weeks, have you felt down, depressed or hopeless?: no Over the past 2 weeks, have you felt little interest or pleasure in doing things?:no   Gynecologic History Patient's last menstrual period was 09/09/2022 (approximate). Contraception: condoms Last Pap: 2024. Results were: normal Last mammogram: 2024. Results were: normal  Obstetric History OB History  Gravida Para Term Preterm AB Living  2 2 2  0 0 2  SAB IAB Ectopic Multiple Live Births  0 0 0 0 2    # Outcome Date GA Lbr Len/2nd Weight Sex Type Anes PTL Lv  2 Term 03/07/07 [redacted]w[redacted]d   F CS-LTranv Spinal  LIV  1 Term 05/28/91 [redacted]w[redacted]d   M CS-Classical Spinal  LIV    Past Medical History:  Diagnosis Date   Anxiety    Eczema    Fibromyalgia    Heart murmur    As a teenager, has not been mentioned in years.   Ovarian cyst    Pulmonary embolism (HCC)    Urinary, incontinence, stress female     Past Surgical History:  Procedure Laterality Date   BRONCHIAL NEEDLE ASPIRATION BIOPSY  06/30/2020   Procedure: BRONCHIAL NEEDLE ASPIRATION BIOPSIES;  Surgeon: Annella Donnice SAUNDERS, MD;  Location: [facility] ENDOSCOPY;  Service: Pulmonary;;    CESAREAN SECTION     general x2 general   VIDEO BRONCHOSCOPY WITH ENDOBRONCHIAL ULTRASOUND N/A 06/30/2020   Procedure: VIDEO BRONCHOSCOPY WITH ENDOBRONCHIAL ULTRASOUND;  Surgeon: Annella Donnice SAUNDERS, MD;  Location: [facility] END[facility]COPY;  Service: Pulmonary;  Laterality: N/A;     Current Outpatient Medications:    apixaban  (ELIQUIS ) 5 MG TABS tablet, Take 1 tablet (5 mg total) by mouth 2 (two) times daily., Disp: 60 tablet, Rfl: 1   acetaminophen  (TYLENOL ) 500 MG tablet, Take 1,000 mg by mouth every 6 (six) hours as needed for moderate pain or headache. (Patient not taking: Reported on 07/28/2023), Disp: , Rfl:    escitalopram  (LEXAPRO ) 10 MG tablet, Take 1 tablet (10 mg total) by mouth daily. (Patient not taking: Reported on 07/28/2023), Disp: 30 tablet, Rfl: 11   LINZESS 145 MCG CAPS capsule, Take 145 mcg by mouth daily as needed (constipation). (Patient not taking: Reported on 07/28/2023), Disp: , Rfl:    metroNIDAZOLE  (FLAGYL ) 500 MG tablet, TAKE 1 TABLET(500 MG) BY MOUTH TWICE DAILY (Patient not taking: Reported on 07/28/2023), Disp: 14 tablet, Rfl: 2   Multiple Vitamin (MULTIVITAMIN WITH MINERALS) TABS tablet, Take 1 tablet by mouth in the morning. (Patient not taking: Reported on 07/28/2023), Disp: , Rfl:    NUVARING  0.12-0.015 MG/24HR vaginal ring, Place 1 each vaginally every 28 (twenty-eight) days. (Patient not taking: Reported on 07/28/2023), Disp: 1 each, Rfl: 1   tiZANidine  (ZANAFLEX ) 4  MG tablet, Take 1 tablet (4 mg total) by mouth every 6 (six) hours as needed for muscle spasms. (Patient not taking: Reported on 07/28/2023), Disp: 30 tablet, Rfl: 0   varenicline  (CHANTIX ) 1 MG tablet, TAKE 1 TABLET BY MOUTH TWICE DAILY (Patient not taking: Reported on 07/28/2023), Disp: 60 tablet, Rfl: 0 Allergies  Allergen Reactions   Chlorhexidine Gluconate Itching and Rash   Latex Rash    Social History   Tobacco Use   Smoking status: Every Day    Current packs/day: 0.00    Average packs/day: 0.3  packs/day for 19.3 years (4.8 ttl pk-yrs)    Types: E-cigarettes, Cigarettes    Start date: 2003    Last attempt to quit: 05/02/2020    Years since quitting: 3.2   Smokeless tobacco: Never  Substance Use Topics   Alcohol use: Yes    Alcohol/week: 2.0 standard drinks of alcohol    Types: 1 Cans of beer, 1 Shots of liquor per week    Comment: socially    Family History  Problem Relation Age of Onset   Diabetes Mother    Hypertension Mother    Asthma Mother    Heart disease Mother    Hyperlipidemia Mother    Colonic polyp Mother    Rheum arthritis Mother    Alcoholism Father    Fibroids Maternal Grandmother    Aneurysm Maternal Grandmother    Heart disease Maternal Grandmother    Hypertension Maternal Grandmother    Hyperlipidemia Maternal Grandmother    Stroke Maternal Grandmother    Kidney disease Maternal Grandfather    Alcoholism Paternal Grandmother    Diabetes Paternal Grandmother    Irritable bowel syndrome Maternal Aunt    Colon cancer Maternal Uncle    Cancer Other        members with gyn ca; pancreatic   Heart Problems Cousin       Review of Systems  Constitutional: negative for fatigue and weight loss Respiratory: negative for cough and wheezing Cardiovascular: negative for chest pain, fatigue and palpitations Gastrointestinal: negative for abdominal pain and change in bowel habits Musculoskeletal:negative for myalgias Neurological: negative for gait problems and tremors Behavioral/Psych: negative for abusive relationship, depression Endocrine: negative for temperature intolerance    Genitourinary: positive for hot flashes and vaginal discharge.  negative for abnormal menstrual periods, genital lesions, hot flashes, sexual problems  Integument/breast: negative for breast lump, breast tenderness, nipple discharge and skin lesion(s)    Objective:       BP 134/75   Pulse 82   Ht 5' 3 (1.6 m)   Wt 153 lb 6.4 oz (69.6 kg)   LMP 09/09/2022 (Approximate)    BMI 27.17 kg/m  General:   Alert and no distress  Skin:   no rash or abnormalities  Lungs:   clear to auscultation bilaterally  Heart:   regular rate and rhythm, S1, S2 normal, no murmur, click, rub or gallop  Breasts:   normal without suspicious masses, skin or nipple changes or axillary nodes  Abdomen:  normal findings: no organomegaly, soft, non-tender and no hernia  Pelvis:  External genitalia: normal general appearance Urinary system: urethral meatus normal and bladder without fullness, nontender Vaginal: normal without tenderness, induration or masses Cervix: normal appearance Adnexa: normal bimanual exam Uterus: anteverted and non-tender, normal size   Lab Review Urine pregnancy test Labs reviewed yes Radiologic studies reviewed yes  I have spent a total of 20 minutes of face-to-face time, excluding clinical staff time, reviewing notes and  preparing to see patient, ordering tests and/or medications, and counseling the patient.   Assessment:    1. Encounter for gynecological examination with Papanicolaou smear of cervix (Primary) Rx: - Cytology - PAP( [facility])  2. Hot flashes, menopausal - non-hormonal options discussed  3. Vaginal discharge Rx: - Cervicovaginal ancillary only( )  4. Screening breast examination Rx: - MM Digital Screening; Future  5. Tobacco dependence - cessation recommended  6. Anxiety Rx: - Ambulatory referral to Integrated Behavioral Health     Plan:    Education reviewed: calcium supplements, depression evaluation, low fat, low cholesterol diet, safe sex/STD prevention, self breast exams, skin cancer screening, smoking cessation, and weight bearing exercise. Contraception: condoms. Mammogram ordered. Follow up in: 1 year.    Orders Placed This Encounter  Procedures   MM Digital Screening    Standing Status:   Future    Expiration Date:   07/27/2024    Reason for Exam (SYMPTOM  OR DIAGNOSIS REQUIRED):   Screening     Is the patient pregnant?:   No    Preferred imaging location?:   [facility]   Ambulatory referral to Integrated Behavioral Health    Referral Priority:   Routine    Referral Type:   Consultation    Referral Reason:   Specialty Services Required    Number of Visits Requested:   1   CARLIN RONAL CENTERS, MD, FACOG Attending Obstetrician & Gynecologist, [facility] for [facility], [facility], [place] 07/28/2023

## 2023-07-30 LAB — CERVICOVAGINAL ANCILLARY ONLY
Bacterial Vaginitis (gardnerella): POSITIVE — AB
Candida Glabrata: NEGATIVE
Candida Vaginitis: NEGATIVE
Chlamydia: NEGATIVE
Comment: NEGATIVE
Comment: NEGATIVE
Comment: NEGATIVE
Comment: NEGATIVE
Comment: NEGATIVE
Comment: NORMAL
Neisseria Gonorrhea: NEGATIVE
Trichomonas: NEGATIVE

## 2023-07-31 ENCOUNTER — Ambulatory Visit: Payer: Self-pay | Admitting: Obstetrics

## 2023-07-31 ENCOUNTER — Other Ambulatory Visit: Payer: Self-pay | Admitting: Obstetrics

## 2023-07-31 DIAGNOSIS — B9689 Other specified bacterial agents as the cause of diseases classified elsewhere: Secondary | ICD-10-CM

## 2023-07-31 MED ORDER — METRONIDAZOLE 500 MG PO TABS: 500.0000 mg | ORAL_TABLET | Freq: Two times a day (BID) | ORAL | 2 refills | Status: AC

## 2023-08-05 LAB — CYTOLOGY - PAP
Comment: NEGATIVE
Diagnosis: NEGATIVE
High risk HPV: NEGATIVE

## 2023-08-22 ENCOUNTER — Ambulatory Visit

## 2023-08-22 ENCOUNTER — Ambulatory Visit
Admission: RE | Admit: 2023-08-22 | Discharge: 2023-08-22 | Disposition: A | Discharge status: Home / Self Care | Source: Ambulatory Visit | Attending: Obstetrics | Admitting: Obstetrics

## 2023-08-22 DIAGNOSIS — Z1239 Encounter for other screening for malignant neoplasm of breast: Secondary | ICD-10-CM

## 2023-08-26 ENCOUNTER — Other Ambulatory Visit: Payer: Self-pay | Admitting: Obstetrics

## 2023-08-26 DIAGNOSIS — R928 Other abnormal and inconclusive findings on diagnostic imaging of breast: Secondary | ICD-10-CM

## 2023-09-01 ENCOUNTER — Inpatient Hospital Stay
Admission: RE | Admit: 2023-09-01 | Discharge: 2023-09-01 | Discharge status: Home / Self Care | Source: Ambulatory Visit | Attending: Obstetrics | Admitting: Obstetrics

## 2023-09-01 ENCOUNTER — Ambulatory Visit
Admission: RE | Admit: 2023-09-01 | Discharge: 2023-09-01 | Disposition: A | Discharge status: Home / Self Care | Source: Ambulatory Visit | Attending: Obstetrics | Admitting: Obstetrics

## 2023-09-01 DIAGNOSIS — R928 Other abnormal and inconclusive findings on diagnostic imaging of breast: Secondary | ICD-10-CM
